# Patient Record
Sex: Female | Born: 1975 | Hispanic: No | Marital: Married | Smoking: Never smoker
Health system: Southern US, Community
[De-identification: ages and names within clinical notes are randomized; demographics above are authoritative.]

## PROBLEM LIST (undated history)

## (undated) ENCOUNTER — Inpatient Hospital Stay (HOSPITAL_COMMUNITY): Payer: Self-pay

## (undated) DIAGNOSIS — R51 Headache: Secondary | ICD-10-CM

## (undated) DIAGNOSIS — Z5189 Encounter for other specified aftercare: Secondary | ICD-10-CM

## (undated) DIAGNOSIS — IMO0002 Reserved for concepts with insufficient information to code with codable children: Secondary | ICD-10-CM

## (undated) DIAGNOSIS — B999 Unspecified infectious disease: Secondary | ICD-10-CM

## (undated) HISTORY — PX: HERNIA REPAIR: SHX51

---

## 2014-01-05 ENCOUNTER — Inpatient Hospital Stay (HOSPITAL_COMMUNITY): Payer: Self-pay

## 2014-01-05 ENCOUNTER — Encounter (HOSPITAL_COMMUNITY): Payer: Self-pay | Admitting: *Deleted

## 2014-01-05 ENCOUNTER — Inpatient Hospital Stay (HOSPITAL_COMMUNITY)
Admission: AD | Admit: 2014-01-05 | Discharge: 2014-01-05 | Disposition: A | Discharge status: Home / Self Care | Payer: Self-pay | Source: Ambulatory Visit | Attending: Obstetrics & Gynecology | Admitting: Obstetrics & Gynecology

## 2014-01-05 DIAGNOSIS — O4690 Antepartum hemorrhage, unspecified, unspecified trimester: Secondary | ICD-10-CM | POA: Insufficient documentation

## 2014-01-05 DIAGNOSIS — Z3201 Encounter for pregnancy test, result positive: Secondary | ICD-10-CM | POA: Insufficient documentation

## 2014-01-05 DIAGNOSIS — O209 Hemorrhage in early pregnancy, unspecified: Secondary | ICD-10-CM

## 2014-01-05 DIAGNOSIS — Z3490 Encounter for supervision of normal pregnancy, unspecified, unspecified trimester: Secondary | ICD-10-CM

## 2014-01-05 DIAGNOSIS — Z3480 Encounter for supervision of other normal pregnancy, unspecified trimester: Secondary | ICD-10-CM

## 2014-01-05 HISTORY — DX: Reserved for concepts with insufficient information to code with codable children: IMO0002

## 2014-01-05 HISTORY — DX: Encounter for other specified aftercare: Z51.89

## 2014-01-05 HISTORY — DX: Headache: R51

## 2014-01-05 HISTORY — DX: Unspecified infectious disease: B99.9

## 2014-01-05 LAB — CBC
HCT: 33.7 % — ABNORMAL LOW (ref 36.0–46.0)
Hemoglobin: 11.1 g/dL — ABNORMAL LOW (ref 12.0–15.0)
MCH: 27.4 pg (ref 26.0–34.0)
MCHC: 32.9 g/dL (ref 30.0–36.0)
MCV: 83.2 fL (ref 78.0–100.0)
PLATELETS: 164 10*3/uL (ref 150–400)
RBC: 4.05 MIL/uL (ref 3.87–5.11)
RDW: 14.3 % (ref 11.5–15.5)
WBC: 7.3 10*3/uL (ref 4.0–10.5)

## 2014-01-05 LAB — WET PREP, GENITAL
CLUE CELLS WET PREP: NONE SEEN
Trich, Wet Prep: NONE SEEN
Yeast Wet Prep HPF POC: NONE SEEN

## 2014-01-05 LAB — URINALYSIS, ROUTINE W REFLEX MICROSCOPIC
BILIRUBIN URINE: NEGATIVE
Glucose, UA: NEGATIVE mg/dL
Ketones, ur: NEGATIVE mg/dL
Leukocytes, UA: NEGATIVE
NITRITE: NEGATIVE
Protein, ur: NEGATIVE mg/dL
UROBILINOGEN UA: 0.2 mg/dL (ref 0.0–1.0)
pH: 6 (ref 5.0–8.0)

## 2014-01-05 LAB — URINE MICROSCOPIC-ADD ON

## 2014-01-05 LAB — POCT PREGNANCY, URINE: PREG TEST UR: POSITIVE — AB

## 2014-01-05 LAB — HCG, QUANTITATIVE, PREGNANCY: HCG, BETA CHAIN, QUANT, S: 138995 m[IU]/mL — AB (ref <5)

## 2014-01-05 LAB — ABO/RH: ABO/RH(D): O POS

## 2014-01-05 NOTE — MAU Provider Note (Signed)
History     CSN: [id number]  Arrival date and time: 01/05/14 [id number]   First Provider Initiated Contact with Patient 01/05/14 1057      Chief Complaint  Patient presents with  . Possible Pregnancy  . Vaginal Bleeding   HPI  Angela Strickland is a 38 y.o. female [id number] at 3957w2d who presents with brown vaginal discharge that started on Saturday. The patient is also here for a confirmation of pregnancy; this pregnancy was not planned. She denies pain at this time and feels the discharge has decreased in amount since Saturday.   OB History   Grav Para Term Preterm Abortions TAB SAB Ect Mult Living   5 3 2 1 1  0 1 0 0 3      Past Medical History  Diagnosis Date  . Blood transfusion without reported diagnosis     with c/s  . Headache(784.0)   . Infection     UTI  . Degenerative disk disease     Past Surgical History  Procedure Laterality Date  . Cesarean section    . Hernia repair      Family History  Problem Relation Age of Onset  . Diabetes Mother   . Cancer Father     liver  . Hearing loss Other     History  Substance Use Topics  . Smoking status: Never Smoker   . Smokeless tobacco: Never Used  . Alcohol Use: No    Allergies: No Known Allergies  Prescriptions prior to admission  Medication Sig Dispense Refill  . Budesonide (RHINOCORT AQUA NA) Place 1 spray into the nose daily as needed (congestion).       Results for orders placed during the hospital encounter of 01/05/14 (from the past 48 hour(s))  URINALYSIS, ROUTINE W REFLEX MICROSCOPIC     Status: Abnormal   Collection Time    01/05/14 10:20 AM      Result Value Ref Range   Color, Urine YELLOW  YELLOW   APPearance CLEAR  CLEAR   Specific Gravity, Urine >1.030 (*) 1.005 - 1.030   pH 6.0  5.0 - 8.0   Glucose, UA NEGATIVE  NEGATIVE mg/dL   Hgb urine dipstick MODERATE (*) NEGATIVE   Bilirubin Urine NEGATIVE  NEGATIVE   Ketones, ur NEGATIVE  NEGATIVE mg/dL   Protein, ur NEGATIVE  NEGATIVE mg/dL   Urobilinogen, UA 0.2  0.0 - 1.0 mg/dL   Nitrite NEGATIVE  NEGATIVE   Leukocytes, UA NEGATIVE  NEGATIVE  URINE MICROSCOPIC-ADD ON     Status: Abnormal   Collection Time    01/05/14 10:20 AM      Result Value Ref Range   Squamous Epithelial / LPF FEW (*) RARE   WBC, UA 0-2  <3 WBC/hpf   RBC / HPF 7-10  <3 RBC/hpf   Bacteria, UA MANY (*) RARE   Urine-Other MUCOUS PRESENT    POCT PREGNANCY, URINE     Status: Abnormal   Collection Time    01/05/14 10:33 AM      Result Value Ref Range   Preg Test, Ur POSITIVE (*) NEGATIVE   Comment:            THE SENSITIVITY OF THIS     METHODOLOGY IS >24 mIU/mL  ABO/RH     Status: None   Collection Time    01/05/14 10:57 AM      Result Value Ref Range   ABO/RH(D) O POS    HCG, QUANTITATIVE, PREGNANCY  Status: Abnormal   Collection Time    01/05/14 10:57 AM      Result Value Ref Range   hCG, Beta Francene FindersChain, Quant, S [id number] (*) <5 mIU/mL   Comment:              GEST. AGE      CONC.  (mIU/mL)       <=1 WEEK        5 - 50         2 WEEKS       50 - 500         3 WEEKS       100 - 10,000         4 WEEKS     1,000 - 30,000         5 WEEKS     3,500 - 115,000       6-8 WEEKS     12,000 - 270,000        12 WEEKS     15,000 - 220,000                FEMALE AND NON-PREGNANT FEMALE:         LESS THAN 5 mIU/mL  CBC     Status: Abnormal   Collection Time    01/05/14 10:58 AM      Result Value Ref Range   WBC 7.3  4.0 - 10.5 K/uL   RBC 4.05  3.87 - 5.11 MIL/uL   Hemoglobin 11.1 (*) 12.0 - 15.0 g/dL   HCT 04.533.7 (*) 40.936.0 - 81.146.0 %   MCV 83.2  78.0 - 100.0 fL   MCH 27.4  26.0 - 34.0 pg   MCHC 32.9  30.0 - 36.0 g/dL   RDW 91.414.3  78.211.5 - 95.615.5 %   Platelets 164  150 - 400 K/uL   [place] Ob Comp Less 14 Wks  01/05/2014   CLINICAL DATA:  Vaginal bleeding.  Positive pregnancy test.  EXAM: OBSTETRIC <14 WK US AND TRANSVAGINAL OB US  TECHNIQUE: Both transabdominal and transvaginal ultrasound examinations were performed for complete evaluation of the gestation as well  as the maternal uterus, adnexal regions, and pelvic cul-de-sac. Transvaginal technique was performed to assess early pregnancy.  COMPARISON:  None.  FINDINGS: Intrauterine gestational sac: Visualized/normal in shape.  Yolk sac:  Visualized  Embryo:  Visualized  Cardiac Activity: Visualized  Heart Rate:  160 bpm  CRL:   16  mm   8 w 0 d                  US EDC: 08/17/2014  Maternal uterus/adnexae: Both ovaries are normal in appearance. No mass or free fluid visualized.  IMPRESSION: Single living IUP measuring 8 weeks 0 days with US EDC of 08/17/2014.  No significant maternal uterine or adnexal abnormality identified.   Electronically Signed   By: Myles RosenthalJohn  Stahl M.D.   On: 01/05/2014 11:43   [place] Ob Transvaginal  01/05/2014   CLINICAL DATA:  Vaginal bleeding.  Positive pregnancy test.  EXAM: OBSTETRIC <14 WK US AND TRANSVAGINAL OB US  TECHNIQUE: Both transabdominal and transvaginal ultrasound examinations were performed for complete evaluation of the gestation as well as the maternal uterus, adnexal regions, and pelvic cul-de-sac. Transvaginal technique was performed to assess early pregnancy.  COMPARISON:  None.  FINDINGS: Intrauterine gestational sac: Visualized/normal in shape.  Yolk sac:  Visualized  Embryo:  Visualized  Cardiac Activity: Visualized  Heart Rate:  160 bpm  CRL:  16  mm   8 w 0 d                  [place] EDC: 08/17/2014  Maternal uterus/adnexae: Both ovaries are normal in appearance. No mass or free fluid visualized.  IMPRESSION: Single living IUP measuring 8 weeks 0 days with [place] EDC of 08/17/2014.  No significant maternal uterine or adnexal abnormality identified.   Electronically Signed   By: Myles Rosenthal M.D.   On: 01/05/2014 11:43     Review of Systems  Constitutional: Negative for fever and chills.  Gastrointestinal: Negative for nausea, vomiting, abdominal pain, diarrhea and constipation.  Genitourinary: Negative for dysuria, urgency, frequency and hematuria.       No vaginal discharge. +  vaginal bleeding; small amount- brown  No dysuria.    Physical Exam   Blood pressure 115/66, pulse 99, temperature 98.5 F (36.9 C), resp. rate 16, height 5\' 3"  (1.6 m), weight 61.689 kg (136 lb), last menstrual period 11/15/2013, SpO2 100.00%.  Physical Exam  Constitutional: She is oriented to person, place, and time. She appears well-developed and well-nourished. No distress.  HENT:  Head: Normocephalic.  Eyes: Pupils are equal, round, and reactive to light.  Neck: Neck supple.  Respiratory: Effort normal.  GI: Soft. She exhibits no distension. There is no tenderness. There is no rebound.  Genitourinary:  Speculum exam: Vagina - Small amount of creamy, brown discharge, no odor Cervix - No contact bleeding, small amount of brown, mucus like discharge at cervical os.  Bimanual exam: Cervix closed, no CMT  Uterus non tender, enlarged.  Adnexa non tender, no masses bilaterally GC/Chlam, wet prep done Chaperone present for exam.   Musculoskeletal: Normal range of motion.  Neurological: She is alert and oriented to person, place, and time.  Skin: Skin is warm. She is not diaphoretic.  Psychiatric: Her behavior is normal.    MAU Course  Procedures None  MDM ABO Beta hcg CBC [place] Wet prep GC O positive blood type   Assessment and Plan   Assessment:  Single IUP with cardiac activity  Vaginal bleeding in early pregnancy   Plan:  Discharge home in stable condition Bleeding precautions discussed Start prenatal care ASAP Contact the [facility] Department Return to MAU as needed, if symptoms worsen  O positive blood type.   Iona Hansen Rasch, NP  01/05/2014, 2:42 PM

## 2014-01-05 NOTE — MAU Provider Note (Signed)
Attestation of Attending Supervision of Advanced Practitioner (CNM/NP): Evaluation and management procedures were performed by the Advanced Practitioner under my supervision and collaboration.  I have reviewed the Advanced Practitioner's note and chart, and I agree with the management and plan.  Marquerite Forsman Harraway-Smith 3:15 PM     

## 2014-01-05 NOTE — MAU Note (Signed)
+  HPT about 2 wks ago.  Brownish spotting a couple days ago, no clots.  Some low back pain yesterday, none today.

## 2014-01-05 NOTE — MAU Note (Signed)
Patient states she has had a positive home pregnancy test about 2 weeks ago. States on 4-11 she had a lot of brown discharge. States she continues to have a little brown bleeding, no pain today but had some pain yesterday. Has some vomiting on and off with nausea.

## 2014-01-06 LAB — GC/CHLAMYDIA PROBE AMP
CT PROBE, AMP APTIMA: NEGATIVE
GC PROBE AMP APTIMA: NEGATIVE

## 2014-06-15 ENCOUNTER — Encounter: Payer: Self-pay | Admitting: Family Medicine

## 2014-06-15 ENCOUNTER — Ambulatory Visit (INDEPENDENT_AMBULATORY_CARE_PROVIDER_SITE_OTHER): Payer: Self-pay | Admitting: Family Medicine

## 2014-06-15 VITALS — BP 106/64 | HR 100 | Temp 98.1°F | Wt 151.9 lb

## 2014-06-15 DIAGNOSIS — O0990 Supervision of high risk pregnancy, unspecified, unspecified trimester: Secondary | ICD-10-CM

## 2014-06-15 DIAGNOSIS — O0933 Supervision of pregnancy with insufficient antenatal care, third trimester: Secondary | ICD-10-CM

## 2014-06-15 DIAGNOSIS — O09529 Supervision of elderly multigravida, unspecified trimester: Secondary | ICD-10-CM

## 2014-06-15 DIAGNOSIS — O09523 Supervision of elderly multigravida, third trimester: Secondary | ICD-10-CM

## 2014-06-15 DIAGNOSIS — O0993 Supervision of high risk pregnancy, unspecified, third trimester: Secondary | ICD-10-CM

## 2014-06-15 DIAGNOSIS — O0930 Supervision of pregnancy with insufficient antenatal care, unspecified trimester: Secondary | ICD-10-CM

## 2014-06-15 LAB — POCT URINALYSIS DIP (DEVICE)
BILIRUBIN URINE: NEGATIVE
GLUCOSE, UA: NEGATIVE mg/dL
Hgb urine dipstick: NEGATIVE
Ketones, ur: NEGATIVE mg/dL
LEUKOCYTES UA: NEGATIVE
NITRITE: NEGATIVE
Protein, ur: NEGATIVE mg/dL
Specific Gravity, Urine: 1.02 (ref 1.005–1.030)
UROBILINOGEN UA: 0.2 mg/dL (ref 0.0–1.0)
pH: 6.5 (ref 5.0–8.0)

## 2014-06-15 NOTE — Progress Notes (Signed)
U/S 06/24/14 @ 930a.

## 2014-06-15 NOTE — Progress Notes (Signed)
Nutrition note: 1st visit consult Pt has gained 21.9# @ [redacted]w[redacted]d, which is wnl. Pt reports eating 3 meals & 1-2 snacks/d. Pt was taking a PNV but is only taking folic acid & vitamin B6 currently. Pt reports no N/V but has some heartburn. Pt received verbal & written education on general nutrition during pregnancy. Discussed tips to decrease heartburn. Encouraged PNV. Discussed wt gain goals of 25-35# or 1#/wk. Pt agrees to restart taking a PNV. Pt does not have WIC but plans to apply. Pt plans to BF. F/u as needed Blondell Reveal, MS, RD, LDN, [facility]

## 2014-06-15 NOTE — Progress Notes (Signed)
   Subjective:    Angela Strickland is a [id number] [redacted]w[redacted]d being seen today for her first obstetrical visit.  Her obstetrical history is significant for advanced maternal age. Patient does intend to breast feed. Pregnancy history fully reviewed.  Patient reports no complaints.  Filed Vitals:   06/15/14 0910  BP: 106/64  Pulse: 100  Temp: 98.1 F (36.7 C)  Weight: 151 lb 14.4 oz (68.9 kg)    HISTORY: OB History  Gravida Para Term Preterm AB SAB TAB Ectopic Multiple Living  0 0 0 3    # Outcome Date GA Lbr Len/2nd Weight Sex Delivery Anes PTL Lv  5 CUR           4 SAB           3 PRE              Comments: previa, emergency c/s  2 TRM      SVD   Y  1 TRM      SVD   Y     Emergent C/s 2/2 placenta previa with vaginal bleeding at 29-30 weeks, was told she could have a vaginal delivery after the cesarean.  Past Medical History  Diagnosis Date  . Blood transfusion without reported diagnosis     with c/s  . Headache(784.0)   . Infection     UTI  . Degenerative disk disease    Past Surgical History  Procedure Laterality Date  . Cesarean section    . Hernia repair     Family History  Problem Relation Age of Onset  . Diabetes Mother   . Cancer Father     liver  . Hearing loss Other      Exam    Uterus:     Pelvic Exam:    Perineum: No Hemorrhoids   Vulva: normal   Vagina:  normal mucosa, normal discharge   pH: Not performed   Cervix: multiparous appearance, no cervical motion tenderness and no lesions   Adnexa: normal adnexa   Bony Pelvis: average  System:     Skin: normal coloration and turgor, no rashes    Neurologic: oriented, normal, normal mood   Extremities: normal strength, tone, and muscle mass, no deformities, no erythema, induration, or nodules   HEENT PERRLA and extra ocular movement intact   Mouth/Teeth mucous membranes moist, pharynx normal without lesions   Neck supple and no masses   Cardiovascular: Regular rate   Respiratory:   appears well, vitals normal, no respiratory distress, acyanotic, normal RR, ear and throat exam is normal, neck free of mass or lymphadenopathy, chest clear, no wheezing, crepitations, rhonchi, normal symmetric air entry   Abdomen: soft, non-tender; bowel sounds normal; no masses,  no organomegaly   Urinary: urethral meatus normal      Assessment:    Pregnancy: [id number] There are no active problems to display for this patient.       Plan:     Initial labs drawn. Prenatal vitamins. Problem list reviewed and updated. Genetic Screening discussed - too late  Ultrasound discussed; fetal survey: ordered.  Follow up in 2 weeks. 50% of 25 min visit spent on counseling and coordination of care.    Angela Strickland 06/15/2014

## 2014-06-15 NOTE — Addendum Note (Signed)
Addended by: Kathee Delton on: 06/15/2014 04:47 PM   Modules accepted: Orders

## 2014-06-16 LAB — GC/CHLAMYDIA PROBE AMP
CT Probe RNA: NEGATIVE
GC PROBE AMP APTIMA: NEGATIVE

## 2014-06-16 LAB — HIV ANTIBODY (ROUTINE TESTING W REFLEX): HIV 1&2 Ab, 4th Generation: NONREACTIVE

## 2014-06-16 LAB — GLUCOSE TOLERANCE, 1 HOUR (50G) W/O FASTING: Glucose, 1 Hour GTT: 156 mg/dL — ABNORMAL HIGH (ref 70–140)

## 2014-06-17 LAB — PRESCRIPTION MONITORING PROFILE (19 PANEL)
Amphetamine/Meth: NEGATIVE ng/mL
Barbiturate Screen, Urine: NEGATIVE ng/mL
Benzodiazepine Screen, Urine: NEGATIVE ng/mL
Buprenorphine, Urine: NEGATIVE ng/mL
Cannabinoid Scrn, Ur: NEGATIVE ng/mL
Carisoprodol, Urine: NEGATIVE ng/mL
Cocaine Metabolites: NEGATIVE ng/mL
Creatinine, Urine: 113.88 mg/dL (ref >20.0)
ECSTASY: NEGATIVE ng/mL
Fentanyl, Ur: NEGATIVE ng/mL
MEPERIDINE UR: NEGATIVE ng/mL
Methadone Screen, Urine: NEGATIVE ng/mL
Methaqualone: NEGATIVE ng/mL
Nitrites, Initial: NEGATIVE ug/mL
Opiate Screen, Urine: NEGATIVE ng/mL
Oxycodone Screen, Ur: NEGATIVE ng/mL
PH URINE, INITIAL: 6.4 pH (ref 4.5–8.9)
Phencyclidine, Ur: NEGATIVE ng/mL
Propoxyphene: NEGATIVE ng/mL
Tapentadol, urine: NEGATIVE ng/mL
Tramadol Scrn, Ur: NEGATIVE ng/mL
Zolpidem, Urine: NEGATIVE ng/mL

## 2014-06-17 LAB — OBSTETRIC PANEL
Antibody Screen: NEGATIVE
Basophils Absolute: 0 10*3/uL (ref 0.0–0.1)
Basophils Relative: 0 % (ref 0–1)
EOS PCT: 3 % (ref 0–5)
Eosinophils Absolute: 0.2 10*3/uL (ref 0.0–0.7)
HCT: 29 % — ABNORMAL LOW (ref 36.0–46.0)
HEP B S AG: NEGATIVE
Hemoglobin: 9.7 g/dL — ABNORMAL LOW (ref 12.0–15.0)
LYMPHS PCT: 25 % (ref 12–46)
Lymphs Abs: 1.5 10*3/uL (ref 0.7–4.0)
MCH: 26.2 pg (ref 26.0–34.0)
MCHC: 33.4 g/dL (ref 30.0–36.0)
MCV: 78.4 fL (ref 78.0–100.0)
Monocytes Absolute: 0.4 10*3/uL (ref 0.1–1.0)
Monocytes Relative: 6 % (ref 3–12)
NEUTROS PCT: 66 % (ref 43–77)
Neutro Abs: 3.9 10*3/uL (ref 1.7–7.7)
PLATELETS: 127 10*3/uL — AB (ref 150–400)
RBC: 3.7 MIL/uL — ABNORMAL LOW (ref 3.87–5.11)
RDW: 14.4 % (ref 11.5–15.5)
RUBELLA: 2.1 {index} — AB (ref <0.90)
Rh Type: POSITIVE
WBC: 5.9 10*3/uL (ref 4.0–10.5)

## 2014-06-17 LAB — CYTOLOGY - PAP

## 2014-06-17 LAB — CULTURE, OB URINE

## 2014-06-18 LAB — HEMOGLOBINOPATHY EVALUATION
HGB F QUANT: 0 % (ref 0.0–2.0)
HGB S QUANTITAION: 0 %
Hemoglobin Other: 0 %
Hgb A2 Quant: 2.7 % (ref 2.2–3.2)
Hgb A: 97.3 % (ref 96.8–97.8)

## 2014-06-22 ENCOUNTER — Telehealth: Payer: Self-pay

## 2014-06-22 NOTE — Telephone Encounter (Signed)
Patient 1hr gtt elevated-- needs 3hr gtt. Called patient with [facility] interpreter [phone]. Informed patient of results and the need for 3hr gtt. Patient states she has U/S appointment this Wednesday and would like to come the same day. Informed patient she could. Patient cannot come until 0845 as she needs to bring her daughter to school at 30. Informed patient we would put her on the schedule for this time. Explained that she must be fasting and cannot have anything to eat or drink after midnight except for water. Patient verbalized understanding and gratitude. No questions or concerns.

## 2014-06-22 NOTE — Telephone Encounter (Signed)
Message copied by Louanna Raw on Mon Jun 22, 2014 11:48 AM ------      Message from: Fredirick Lathe      Created: Sat Jun 20, 2014  9:41 PM       Please schedule for 2h gtt per protocol 2/2 elevated 1h gtt            ----- Message -----         From: [facility]         Sent: 06/16/2014   2:16 AM           To: Perry Mount, MD                   ------

## 2014-06-24 ENCOUNTER — Other Ambulatory Visit: Payer: Self-pay

## 2014-06-24 ENCOUNTER — Ambulatory Visit (HOSPITAL_COMMUNITY)
Admission: RE | Admit: 2014-06-24 | Discharge: 2014-06-24 | Disposition: A | Discharge status: Home / Self Care | Payer: Self-pay | Source: Ambulatory Visit | Attending: Family Medicine | Admitting: Family Medicine

## 2014-06-24 DIAGNOSIS — O0930 Supervision of pregnancy with insufficient antenatal care, unspecified trimester: Secondary | ICD-10-CM | POA: Insufficient documentation

## 2014-06-24 DIAGNOSIS — O09899 Supervision of other high risk pregnancies, unspecified trimester: Secondary | ICD-10-CM | POA: Insufficient documentation

## 2014-06-24 DIAGNOSIS — O0993 Supervision of high risk pregnancy, unspecified, third trimester: Secondary | ICD-10-CM

## 2014-06-24 DIAGNOSIS — O0933 Supervision of pregnancy with insufficient antenatal care, third trimester: Secondary | ICD-10-CM

## 2014-06-24 DIAGNOSIS — O09523 Supervision of elderly multigravida, third trimester: Secondary | ICD-10-CM

## 2014-06-24 DIAGNOSIS — O09529 Supervision of elderly multigravida, unspecified trimester: Secondary | ICD-10-CM | POA: Insufficient documentation

## 2014-06-25 ENCOUNTER — Encounter: Payer: Self-pay | Admitting: Family Medicine

## 2014-06-25 DIAGNOSIS — O24419 Gestational diabetes mellitus in pregnancy, unspecified control: Secondary | ICD-10-CM | POA: Insufficient documentation

## 2014-06-25 LAB — GLUCOSE TOLERANCE, 3 HOURS
GLUCOSE, 1 HOUR-GESTATIONAL: 160 mg/dL (ref 70–189)
Glucose Tolerance, 2 hour: 181 mg/dL — ABNORMAL HIGH (ref 70–164)
Glucose Tolerance, Fasting: 81 mg/dL (ref 70–104)
Glucose, GTT - 3 Hour: 150 mg/dL — ABNORMAL HIGH (ref 70–144)

## 2014-06-29 ENCOUNTER — Encounter: Payer: Self-pay | Admitting: Family Medicine

## 2014-06-29 ENCOUNTER — Encounter: Payer: Self-pay | Attending: Family Medicine | Admitting: *Deleted

## 2014-06-29 ENCOUNTER — Ambulatory Visit (INDEPENDENT_AMBULATORY_CARE_PROVIDER_SITE_OTHER): Payer: Self-pay | Admitting: Family Medicine

## 2014-06-29 VITALS — BP 106/68 | HR 110 | Temp 98.0°F | Wt 152.9 lb

## 2014-06-29 DIAGNOSIS — O3421 Maternal care for scar from previous cesarean delivery: Secondary | ICD-10-CM

## 2014-06-29 DIAGNOSIS — O09513 Supervision of elderly primigravida, third trimester: Secondary | ICD-10-CM | POA: Insufficient documentation

## 2014-06-29 DIAGNOSIS — O34219 Maternal care for unspecified type scar from previous cesarean delivery: Secondary | ICD-10-CM | POA: Insufficient documentation

## 2014-06-29 DIAGNOSIS — Z713 Dietary counseling and surveillance: Secondary | ICD-10-CM | POA: Insufficient documentation

## 2014-06-29 DIAGNOSIS — O0993 Supervision of high risk pregnancy, unspecified, third trimester: Secondary | ICD-10-CM

## 2014-06-29 DIAGNOSIS — O24419 Gestational diabetes mellitus in pregnancy, unspecified control: Secondary | ICD-10-CM | POA: Insufficient documentation

## 2014-06-29 LAB — POCT URINALYSIS DIP (DEVICE)
Bilirubin Urine: NEGATIVE
Glucose, UA: NEGATIVE mg/dL
Ketones, ur: NEGATIVE mg/dL
Leukocytes, UA: NEGATIVE
NITRITE: NEGATIVE
Protein, ur: NEGATIVE mg/dL
Specific Gravity, Urine: 1.015 (ref 1.005–1.030)
Urobilinogen, UA: 0.2 mg/dL (ref 0.0–1.0)
pH: 6 (ref 5.0–8.0)

## 2014-06-29 NOTE — Progress Notes (Signed)
Round ligament pain

## 2014-06-29 NOTE — Patient Instructions (Addendum)
Having a circumcision done in the hospital costs approximately $480.  This will have to be paid in full prior to circumcision being performed.  There are places to have circumcision done as an outpatient which are cheaper.    Circumcisions      Provider   Phone    Price     ------------------------------------------------------------------------------   [facility]  [phone]  $480 by 4 wks     [facility]   [phone]  $[phone] by 4 wks     [facility]   [phone]  $175 by 2 wks    [facility]   [phone]  $250 by 7 days [facility]   [phone]  $150 by 4 wks Vaginal Birth After Cesarean Delivery Vaginal birth after cesarean delivery (VBAC) is giving birth vaginally after previously delivering a baby by a cesarean. In the past, if a woman had a cesarean delivery, all births afterward would be done by cesarean delivery. This is no longer true. It can be safe for the mother to try a vaginal delivery after having a cesarean delivery.  It is important to discuss VBAC with your health care provider early in the pregnancy so you can understand the risks, benefits, and options. It will give you time to decide what is best in your particular case. The final decision about whether to have a VBAC or repeat cesarean delivery should be between you and your health care provider. Any changes in your health or your baby's health during your pregnancy may make it necessary to change your initial decision about VBAC.  WOMEN WHO PLAN TO HAVE A VBAC SHOULD CHECK WITH THEIR HEALTH CARE PROVIDER TO BE SURE THAT:  The previous cesarean delivery was done with a low transverse uterine cut (incision) (not a vertical classical incision).   The birth canal is big enough for the baby.   There were no other operations on the uterus.   An electronic fetal monitor (EFM) will be on at all times during labor.   An operating room will be available and ready in case an emergency cesarean delivery is needed.   A health care provider and  surgical nursing staff will be available at all times during labor to be ready to do an emergency delivery cesarean if necessary.   An anesthesiologist will be present in case an emergency cesarean delivery is needed.   The nursery is prepared and has adequate personnel and necessary equipment available to care for the baby in case of an emergency cesarean delivery. BENEFITS OF VBAC  Shorter stay in the hospital.   Avoidance of risks associated with cesarean delivery, such as:  Surgical complications, such as opening of the incision or hernia in the incision.  Injury to other organs.  Fever. This can occur if an infection develops after surgery. It can also occur as a reaction to the medicine given to make you numb during the surgery.  Less blood loss and need for blood transfusions.  Lower risk of blood clots and infection.  Shorter recovery.   Decreased risk for having to remove the uterus (hysterectomy).   Decreased risk for the placenta to completely or partially cover the opening of the uterus (placenta previa) with a future pregnancy.   Decrease risk in future labor and delivery. RISKS OF A VBAC  Tearing (rupture) of the uterus. This is occurs in less than 1% of VBACs. The risk of this happening is higher if:  Steps are taken to begin the labor process (induce labor) or  stimulate or strengthen contractions (augment labor).   Medicine is used to soften (ripen) the cervix.  Having to remove the uterus (hysterectomy) if it ruptures. VBAC SHOULD NOT BE DONE IF:  The previous cesarean delivery was done with a vertical (classical) or T-shaped incision or you do not know what kind of incision was made.   You had a ruptured uterus.   You have had certain types of surgery on your uterus, such as removal of uterine fibroids. Ask your health care provider about other types of surgeries that prevent you from having a VBAC.  You have certain medical or childbirth  (obstetrical) problems.   There are problems with the baby.   You have had two previous cesarean deliveries and no vaginal deliveries. OTHER FACTS TO KNOW ABOUT VBAC:  It is safe to have an epidural anesthetic with VBAC.   It is safe to turn the baby from a breech position (attempt an external cephalic version).   It is safe to try a VBAC with twins.   VBAC may not be successful if your baby weights 8.8 lb (4 kg) or more. However, weight predictions are not always accurate and should not be used alone to decide if VBAC is right for you.  There is an increased failure rate if the time between the cesarean delivery and VBAC is less than 19 months.   Your health care provider may advise against a VBAC if you have preeclampsia (high blood pressure, protein in the urine, and swelling of face and extremities).   VBAC is often successful if you previously gave birth vaginally.   VBAC is often successful when the labor starts spontaneously before the due date.   Delivering a baby through a VBAC is similar to having a normal spontaneous vaginal delivery. Document Released: 03/04/2007 Document Revised: 01/26/2014 Document Reviewed: 04/10/2013 [facility] Patient Information 2015 [place], [place]. This information is not intended to replace advice given to you by your health care provider. Make sure you discuss any questions you have with your health care provider.  Third Trimester of Pregnancy The third trimester is from week 29 through week 42, months 7 through 9. The third trimester is a time when the fetus is growing rapidly. At the end of the ninth month, the fetus is about 20 inches in length and weighs 6-10 pounds.  BODY CHANGES Your body goes through many changes during pregnancy. The changes vary from woman to woman.   Your weight will continue to increase. You can expect to gain 25-35 pounds (11-16 kg) by the end of the pregnancy.  You may begin to get stretch marks on your  hips, abdomen, and breasts.  You may urinate more often because the fetus is moving lower into your pelvis and pressing on your bladder.  You may develop or continue to have heartburn as a result of your pregnancy.  You may develop constipation because certain hormones are causing the muscles that push waste through your intestines to slow down.  You may develop hemorrhoids or swollen, bulging veins (varicose veins).  You may have pelvic pain because of the weight gain and pregnancy hormones relaxing your joints between the bones in your pelvis. Backaches may result from overexertion of the muscles supporting your posture.  You may have changes in your hair. These can include thickening of your hair, rapid growth, and changes in texture. Some women also have hair loss during or after pregnancy, or hair that feels dry or thin. Your hair will most  likely return to normal after your baby is born.  Your breasts will continue to grow and be tender. A yellow discharge may leak from your breasts called colostrum.  Your belly button may stick out.  You may feel short of breath because of your expanding uterus.  You may notice the fetus "dropping," or moving lower in your abdomen.  You may have a bloody mucus discharge. This usually occurs a few days to a week before labor begins.  Your cervix becomes thin and soft (effaced) near your due date. WHAT TO EXPECT AT YOUR PRENATAL EXAMS  You will have prenatal exams every 2 weeks until week 36. Then, you will have weekly prenatal exams. During a routine prenatal visit:  You will be weighed to make sure you and the fetus are growing normally.  Your blood pressure is taken.  Your abdomen will be measured to track your baby's growth.  The fetal heartbeat will be listened to.  Any test results from the previous visit will be discussed.  You may have a cervical check near your due date to see if you have effaced. At around 36 weeks, your  caregiver will check your cervix. At the same time, your caregiver will also perform a test on the secretions of the vaginal tissue. This test is to determine if a type of bacteria, Group B streptococcus, is present. Your caregiver will explain this further. Your caregiver may ask you:  What your birth plan is.  How you are feeling.  If you are feeling the baby move.  If you have had any abnormal symptoms, such as leaking fluid, bleeding, severe headaches, or abdominal cramping.  If you have any questions. Other tests or screenings that may be performed during your third trimester include:  Blood tests that check for low iron levels (anemia).  Fetal testing to check the health, activity level, and growth of the fetus. Testing is done if you have certain medical conditions or if there are problems during the pregnancy. FALSE LABOR You may feel small, irregular contractions that eventually go away. These are called Braxton Hicks contractions, or false labor. Contractions may last for hours, days, or even weeks before true labor sets in. If contractions come at regular intervals, intensify, or become painful, it is best to be seen by your caregiver.  SIGNS OF LABOR   Menstrual-like cramps.  Contractions that are 5 minutes apart or less.  Contractions that start on the top of the uterus and spread down to the lower abdomen and back.  A sense of increased pelvic pressure or back pain.  A watery or bloody mucus discharge that comes from the vagina. If you have any of these signs before the 37th week of pregnancy, call your caregiver right away. You need to go to the hospital to get checked immediately. HOME CARE INSTRUCTIONS   Avoid all smoking, herbs, alcohol, and unprescribed drugs. These chemicals affect the formation and growth of the baby.  Follow your caregiver's instructions regarding medicine use. There are medicines that are either safe or unsafe to take during  pregnancy.  Exercise only as directed by your caregiver. Experiencing uterine cramps is a good sign to stop exercising.  Continue to eat regular, healthy meals.  Wear a good support bra for breast tenderness.  Do not use hot tubs, steam rooms, or saunas.  Wear your seat belt at all times when driving.  Avoid raw meat, uncooked cheese, cat litter boxes, and soil used by cats. These carry  germs that can cause birth defects in the baby.  Take your prenatal vitamins.  Try taking a stool softener (if your caregiver approves) if you develop constipation. Eat more high-fiber foods, such as fresh vegetables or fruit and whole grains. Drink plenty of fluids to keep your urine clear or pale yellow.  Take warm sitz baths to soothe any pain or discomfort caused by hemorrhoids. Use hemorrhoid cream if your caregiver approves.  If you develop varicose veins, wear support hose. Elevate your feet for 15 minutes, 3-4 times a day. Limit salt in your diet.  Avoid heavy lifting, wear low heal shoes, and practice good posture.  Rest a lot with your legs elevated if you have leg cramps or low back pain.  Visit your dentist if you have not gone during your pregnancy. Use a soft toothbrush to brush your teeth and be gentle when you floss.  A sexual relationship may be continued unless your caregiver directs you otherwise.  Do not travel far distances unless it is absolutely necessary and only with the approval of your caregiver.  Take prenatal classes to understand, practice, and ask questions about the labor and delivery.  Make a trial run to the hospital.  Pack your hospital bag.  Prepare the baby's nursery.  Continue to go to all your prenatal visits as directed by your caregiver. SEEK MEDICAL CARE IF:  You are unsure if you are in labor or if your water has broken.  You have dizziness.  You have mild pelvic cramps, pelvic pressure, or nagging pain in your abdominal area.  You have  persistent nausea, vomiting, or diarrhea.  You have a bad smelling vaginal discharge.  You have pain with urination. SEEK IMMEDIATE MEDICAL CARE IF:   You have a fever.  You are leaking fluid from your vagina.  You have spotting or bleeding from your vagina.  You have severe abdominal cramping or pain.  You have rapid weight loss or gain.  You have shortness of breath with chest pain.  You notice sudden or extreme swelling of your face, hands, ankles, feet, or legs.  You have not felt your baby move in over an hour.  You have severe headaches that do not go away with medicine.  You have vision changes. Document Released: 09/05/2001 Document Revised: 09/16/2013 Document Reviewed: 11/12/2012 [facility] Patient Information 2015 [place], [place]. This information is not intended to replace advice given to you by your health care provider. Make sure you discuss any questions you have with your health care provider.  Breastfeeding Deciding to breastfeed is one of the best choices you can make for you and your baby. A change in hormones during pregnancy causes your breast tissue to grow and increases the number and size of your milk ducts. These hormones also allow proteins, sugars, and fats from your blood supply to make breast milk in your milk-producing glands. Hormones prevent breast milk from being released before your baby is born as well as prompt milk flow after birth. Once breastfeeding has begun, thoughts of your baby, as well as his or her sucking or crying, can stimulate the release of milk from your milk-producing glands.  BENEFITS OF BREASTFEEDING For Your Baby  Your first milk (colostrum) helps your baby's digestive system function better.   There are antibodies in your milk that help your baby fight off infections.   Your baby has a lower incidence of asthma, allergies, and sudden infant death syndrome.   The nutrients in breast milk are  better for your baby than  infant formulas and are designed uniquely for your baby's needs.   Breast milk improves your baby's brain development.   Your baby is less likely to develop other conditions, such as childhood obesity, asthma, or type 2 diabetes mellitus.  For You   Breastfeeding helps to create a very special bond between you and your baby.   Breastfeeding is convenient. Breast milk is always available at the correct temperature and costs nothing.   Breastfeeding helps to burn calories and helps you lose the weight gained during pregnancy.   Breastfeeding makes your uterus contract to its prepregnancy size faster and slows bleeding (lochia) after you give birth.   Breastfeeding helps to lower your risk of developing type 2 diabetes mellitus, osteoporosis, and breast or ovarian cancer later in life. SIGNS THAT YOUR BABY IS HUNGRY Early Signs of Hunger  Increased alertness or activity.  Stretching.  Movement of the head from side to side.  Movement of the head and opening of the mouth when the corner of the mouth or cheek is stroked (rooting).  Increased sucking sounds, smacking lips, cooing, sighing, or squeaking.  Hand-to-mouth movements.  Increased sucking of fingers or hands. Late Signs of Hunger  Fussing.  Intermittent crying. Extreme Signs of Hunger Signs of extreme hunger will require calming and consoling before your baby will be able to breastfeed successfully. Do not wait for the following signs of extreme hunger to occur before you initiate breastfeeding:   Restlessness.  A loud, strong cry.   Screaming. BREASTFEEDING BASICS Breastfeeding Initiation  Find a comfortable place to sit or lie down, with your neck and back well supported.  Place a pillow or rolled up blanket under your baby to bring him or her to the level of your breast (if you are seated). Nursing pillows are specially designed to help support your arms and your baby while you breastfeed.  Make sure  that your baby's abdomen is facing your abdomen.   Gently massage your breast. With your fingertips, massage from your chest wall toward your nipple in a circular motion. This encourages milk flow. You may need to continue this action during the feeding if your milk flows slowly.  Support your breast with 4 fingers underneath and your thumb above your nipple. Make sure your fingers are well away from your nipple and your baby's mouth.   Stroke your baby's lips gently with your finger or nipple.   When your baby's mouth is open wide enough, quickly bring your baby to your breast, placing your entire nipple and as much of the colored area around your nipple (areola) as possible into your baby's mouth.   More areola should be visible above your baby's upper lip than below the lower lip.   Your baby's tongue should be between his or her lower gum and your breast.   Ensure that your baby's mouth is correctly positioned around your nipple (latched). Your baby's lips should create a seal on your breast and be turned out (everted).  It is common for your baby to suck about 2-3 minutes in order to start the flow of breast milk. Latching Teaching your baby how to latch on to your breast properly is very important. An improper latch can cause nipple pain and decreased milk supply for you and poor weight gain in your baby. Also, if your baby is not latched onto your nipple properly, he or she may swallow some air during feeding. This can make your  baby fussy. Burping your baby when you switch breasts during the feeding can help to get rid of the air. However, teaching your baby to latch on properly is still the best way to prevent fussiness from swallowing air while breastfeeding. Signs that your baby has successfully latched on to your nipple:    Silent tugging or silent sucking, without causing you pain.   Swallowing heard between every 3-4 sucks.    Muscle movement above and in front of his  or her ears while sucking.  Signs that your baby has not successfully latched on to nipple:   Sucking sounds or smacking sounds from your baby while breastfeeding.  Nipple pain. If you think your baby has not latched on correctly, slip your finger into the corner of your baby's mouth to break the suction and place it between your baby's gums. Attempt breastfeeding initiation again. Signs of Successful Breastfeeding Signs from your baby:   A gradual decrease in the number of sucks or complete cessation of sucking.   Falling asleep.   Relaxation of his or her body.   Retention of a small amount of milk in his or her mouth.   Letting go of your breast by himself or herself. Signs from you:  Breasts that have increased in firmness, weight, and size 1-3 hours after feeding.   Breasts that are softer immediately after breastfeeding.  Increased milk volume, as well as a change in milk consistency and color by the fifth day of breastfeeding.   Nipples that are not sore, cracked, or bleeding. Signs That Your Pecola Leisure is Getting Enough Milk  Wetting at least 3 diapers in a 24-hour period. The urine should be clear and pale yellow by age 80 days.  At least 3 stools in a 24-hour period by age 80 days. The stool should be soft and yellow.  At least 3 stools in a 24-hour period by age 62 days. The stool should be seedy and yellow.  No loss of weight greater than 10% of birth weight during the first 8 days of age.  Average weight gain of 4-7 ounces (113-198 g) per week after age 188 days.  Consistent daily weight gain by age 80 days, without weight loss after the age of 2 weeks. After a feeding, your baby may spit up a small amount. This is common. BREASTFEEDING FREQUENCY AND DURATION Frequent feeding will help you make more milk and can prevent sore nipples and breast engorgement. Breastfeed when you feel the need to reduce the fullness of your breasts or when your baby shows signs of  hunger. This is called "breastfeeding on demand." Avoid introducing a pacifier to your baby while you are working to establish breastfeeding (the first 4-6 weeks after your baby is born). After this time you may choose to use a pacifier. Research has shown that pacifier use during the first year of a baby's life decreases the risk of sudden infant death syndrome (SIDS). Allow your baby to feed on each breast as long as he or she wants. Breastfeed until your baby is finished feeding. When your baby unlatches or falls asleep while feeding from the first breast, offer the second breast. Because newborns are often sleepy in the first few weeks of life, you may need to awaken your baby to get him or her to feed. Breastfeeding times will vary from baby to baby. However, the following rules can serve as a guide to help you ensure that your baby is properly fed:  Newborns (  babies 53 weeks of age or younger) may breastfeed every 1-3 hours.  Newborns should not go longer than 3 hours during the day or 5 hours during the night without breastfeeding.  You should breastfeed your baby a minimum of 8 times in a 24-hour period until you begin to introduce solid foods to your baby at around 26 months of age. BREAST MILK PUMPING Pumping and storing breast milk allows you to ensure that your baby is exclusively fed your breast milk, even at times when you are unable to breastfeed. This is especially important if you are going back to work while you are still breastfeeding or when you are not able to be present during feedings. Your lactation consultant can give you guidelines on how long it is safe to store breast milk.  A breast pump is a machine that allows you to pump milk from your breast into a sterile bottle. The pumped breast milk can then be stored in a refrigerator or freezer. Some breast pumps are operated by hand, while others use electricity. Ask your lactation consultant which type will work best for you. Breast  pumps can be purchased, but some hospitals and breastfeeding support groups lease breast pumps on a monthly basis. A lactation consultant can teach you how to hand express breast milk, if you prefer not to use a pump.  CARING FOR YOUR BREASTS WHILE YOU BREASTFEED Nipples can become dry, cracked, and sore while breastfeeding. The following recommendations can help keep your breasts moisturized and healthy:  Avoid using soap on your nipples.   Wear a supportive bra. Although not required, special nursing bras and tank tops are designed to allow access to your breasts for breastfeeding without taking off your entire bra or top. Avoid wearing underwire-style bras or extremely tight bras.  Air dry your nipples for 3-52minutes after each feeding.   Use only cotton bra pads to absorb leaked breast milk. Leaking of breast milk between feedings is normal.   Use lanolin on your nipples after breastfeeding. Lanolin helps to maintain your skin's normal moisture barrier. If you use pure lanolin, you do not need to wash it off before feeding your baby again. Pure lanolin is not toxic to your baby. You may also hand express a few drops of breast milk and gently massage that milk into your nipples and allow the milk to air dry. In the first few weeks after giving birth, some women experience extremely full breasts (engorgement). Engorgement can make your breasts feel heavy, warm, and tender to the touch. Engorgement peaks within 3-5 days after you give birth. The following recommendations can help ease engorgement:  Completely empty your breasts while breastfeeding or pumping. You may want to start by applying warm, moist heat (in the shower or with warm water-soaked hand towels) just before feeding or pumping. This increases circulation and helps the milk flow. If your baby does not completely empty your breasts while breastfeeding, pump any extra milk after he or she is finished.  Wear a snug bra (nursing or  regular) or tank top for 1-2 days to signal your body to slightly decrease milk production.  Apply ice packs to your breasts, unless this is too uncomfortable for you.  Make sure that your baby is latched on and positioned properly while breastfeeding. If engorgement persists after 48 hours of following these recommendations, contact your health care provider or a Advertising copywriter. OVERALL HEALTH CARE RECOMMENDATIONS WHILE BREASTFEEDING  Eat healthy foods. Alternate between meals and snacks,  eating 3 of each per day. Because what you eat affects your breast milk, some of the foods may make your baby more irritable than usual. Avoid eating these foods if you are sure that they are negatively affecting your baby.  Drink milk, fruit juice, and water to satisfy your thirst (about 10 glasses a day).   Rest often, relax, and continue to take your prenatal vitamins to prevent fatigue, stress, and anemia.  Continue breast self-awareness checks.  Avoid chewing and smoking tobacco.  Avoid alcohol and drug use. Some medicines that may be harmful to your baby can pass through breast milk. It is important to ask your health care provider before taking any medicine, including all over-the-counter and prescription medicine as well as vitamin and herbal supplements. It is possible to become pregnant while breastfeeding. If birth control is desired, ask your health care provider about options that will be safe for your baby. SEEK MEDICAL CARE IF:   You feel like you want to stop breastfeeding or have become frustrated with breastfeeding.  You have painful breasts or nipples.  Your nipples are cracked or bleeding.  Your breasts are red, tender, or warm.  You have a swollen area on either breast.  You have a fever or chills.  You have nausea or vomiting.  You have drainage other than breast milk from your nipples.  Your breasts do not become full before feedings by the fifth day after you  give birth.  You feel sad and depressed.  Your baby is too sleepy to eat well.  Your baby is having trouble sleeping.   Your baby is wetting less than 3 diapers in a 24-hour period.  Your baby has less than 3 stools in a 24-hour period.  Your baby's skin or the white part of his or her eyes becomes yellow.   Your baby is not gaining weight by 95 days of age. SEEK IMMEDIATE MEDICAL CARE IF:   Your baby is overly tired (lethargic) and does not want to wake up and feed.  Your baby develops an unexplained fever. Document Released: 09/11/2005 Document Revised: 09/16/2013 Document Reviewed: 03/05/2013 [facility] Patient Information 2015 [place], [place]. This information is not intended to replace advice given to you by your health care provider. Make sure you discuss any questions you have with your health care provider.  Gestational Diabetes Mellitus Gestational diabetes mellitus, often simply referred to as gestational diabetes, is a type of diabetes that some women develop during pregnancy. In gestational diabetes, the pancreas does not make enough insulin (a hormone), the cells are less responsive to the insulin that is made (insulin resistance), or both.Normally, insulin moves sugars from food into the tissue cells. The tissue cells use the sugars for energy. The lack of insulin or the lack of normal response to insulin causes excess sugars to build up in the blood instead of going into the tissue cells. As a result, high blood sugar (hyperglycemia) develops. The effect of high sugar (glucose) levels can cause many problems.  RISK FACTORS You have an increased chance of developing gestational diabetes if you have a family history of diabetes and also have one or more of the following risk factors:  A body mass index over 30 (obesity).  A previous pregnancy with gestational diabetes.  An older age at the time of pregnancy. If blood glucose levels are kept in the normal range during  pregnancy, women can have a healthy pregnancy. If your blood glucose levels are not well controlled,  there may be risks to you, your unborn baby (fetus), your labor and delivery, or your newborn baby.  SYMPTOMS  If symptoms are experienced, they are much like symptoms you would normally expect during pregnancy. The symptoms of gestational diabetes include:   Increased thirst (polydipsia).  Increased urination (polyuria).  Increased urination during the night (nocturia).  Weight loss. This weight loss may be rapid.  Frequent, recurring infections.  Tiredness (fatigue).  Weakness.  Vision changes, such as blurred vision.  Fruity smell to your breath.  Abdominal pain. DIAGNOSIS Diabetes is diagnosed when blood glucose levels are increased. Your blood glucose level may be checked by one or more of the following blood tests:  A fasting blood glucose test. You will not be allowed to eat for at least 8 hours before a blood sample is taken.  A random blood glucose test. Your blood glucose is checked at any time of the day regardless of when you ate.  A hemoglobin A1c blood glucose test. A hemoglobin A1c test provides information about blood glucose control over the previous 3 months.  An oral glucose tolerance test (OGTT). Your blood glucose is measured after you have not eaten (fasted) for 1-3 hours and then after you drink a glucose-containing beverage. Since the hormones that cause insulin resistance are highest at about 24-28 weeks of a pregnancy, an OGTT is usually performed during that time. If you have risk factors for gestational diabetes, your health care provider may test you for gestational diabetes earlier than 24 weeks of pregnancy. TREATMENT   You will need to take diabetes medicine or insulin daily to keep blood glucose levels in the desired range.  You will need to match insulin dosing with exercise and healthy food choices. The treatment goal is to maintain the  before-meal (preprandial), bedtime, and overnight blood glucose level at 60-99 mg/dL during pregnancy. The treatment goal is to further maintain peak after-meal blood sugar (postprandial glucose) level at 100-140 mg/dL. HOME CARE INSTRUCTIONS   Have your hemoglobin A1c level checked twice a year.  Perform daily blood glucose monitoring as directed by your health care provider. It is common to perform frequent blood glucose monitoring.  Monitor urine ketones when you are ill and as directed by your health care provider.  Take your diabetes medicine and insulin as directed by your health care provider to maintain your blood glucose level in the desired range.  Never run out of diabetes medicine or insulin. It is needed every day.  Adjust insulin based on your intake of carbohydrates. Carbohydrates can raise blood glucose levels but need to be included in your diet. Carbohydrates provide vitamins, minerals, and fiber which are an essential part of a healthy diet. Carbohydrates are found in fruits, vegetables, whole grains, dairy products, legumes, and foods containing added sugars.  Eat healthy foods. Alternate 3 meals with 3 snacks.  Maintain a healthy weight gain. The usual total expected weight gain varies according to your prepregnancy body mass index (BMI).  Carry a medical alert card or wear your medical alert jewelry.  Carry a 15-gram carbohydrate snack with you at all times to treat low blood glucose (hypoglycemia). Some examples of 15-gram carbohydrate snacks include:  Glucose tablets, 3 or 4.  Glucose gel, 15-gram tube.  Raisins, 2 tablespoons (24 g).  Jelly beans, 6.  Animal crackers, 8.  Fruit juice, regular soda, or low-fat milk, 4 ounces (120 mL).  Gummy treats, 9.  Recognize hypoglycemia. Hypoglycemia during pregnancy occurs with blood glucose levels  of 60 mg/dL and below. The risk for hypoglycemia increases when fasting or skipping meals, during or after intense  exercise, and during sleep. Hypoglycemia symptoms can include:  Tremors or shakes.  Decreased ability to concentrate.  Sweating.  Increased heart rate.  Headache.  Dry mouth.  Hunger.  Irritability.  Anxiety.  Restless sleep.  Altered speech or coordination.  Confusion.  Treat hypoglycemia promptly. If you are alert and able to safely swallow, follow the 15:15 rule:  Take 15-20 grams of rapid-acting glucose or carbohydrate. Rapid-acting options include glucose gel, glucose tablets, or 4 ounces (120 mL) of fruit juice, regular soda, or low-fat milk.  Check your blood glucose level 15 minutes after taking the glucose.  Take 15-20 grams more of glucose if the repeat blood glucose level is still 70 mg/dL or below.  Eat a meal or snack within 1 hour once blood glucose levels return to normal.  Be alert to polyuria (excess urination) and polydipsia (excess thirst) which are early signs of hyperglycemia. An early awareness of hyperglycemia allows for prompt treatment. Treat hyperglycemia as directed by your health care provider.  Engage in at least 30 minutes of physical activity a day or as directed by your health care provider. Ten minutes of physical activity timed 30 minutes after each meal is encouraged to control postprandial blood glucose levels.  Adjust your insulin dosing and food intake as needed if you start a new exercise or sport.  Follow your sick-day plan at any time you are unable to eat or drink as usual.  Avoid tobacco and alcohol use.  Keep all follow-up visits as directed by your health care provider.  Follow the advice of your health care provider regarding your prenatal and post-delivery (postpartum) appointments, meal planning, exercise, medicines, vitamins, blood tests, other medical tests, and physical activities.  Perform daily skin and foot care. Examine your skin and feet daily for cuts, bruises, redness, nail problems, bleeding, blisters, or  sores.  Brush your teeth and gums at least twice a day and floss at least once a day. Follow up with your dentist regularly.  Schedule an eye exam during the first trimester of your pregnancy or as directed by your health care provider.  Share your diabetes management plan with your workplace or school.  Stay up-to-date with immunizations.  Learn to manage stress.  Obtain ongoing diabetes education and support as needed.  Learn about and consider breastfeeding your baby.  You should have your blood sugar level checked 6-12 weeks after delivery. This is done with an oral glucose tolerance test (OGTT). SEEK MEDICAL CARE IF:   You are unable to eat food or drink fluids for more than 6 hours.  You have nausea and vomiting for more than 6 hours.  You have a blood glucose level of 200 mg/dL and you have ketones in your urine.  There is a change in mental status.  You develop vision problems.  You have a persistent headache.  You have upper abdominal pain or discomfort.  You develop an additional serious illness.  You have diarrhea for more than 6 hours.  You have been sick or have had a fever for a couple of days and are not getting better. SEEK IMMEDIATE MEDICAL CARE IF:   You have difficulty breathing.  You no longer feel the baby moving.  You are bleeding or have discharge from your vagina.  You start having premature contractions or labor. MAKE SURE YOU:  Understand these instructions.  Will watch  your condition.  Will get help right away if you are not doing well or get worse. Document Released: 12/18/2000 Document Revised: 01/26/2014 Document Reviewed: 04/09/2012 [facility] Patient Information 2015 [place], [place]. This information is not intended to replace advice given to you by your health care provider. Make sure you discuss any questions you have with your health care provider.

## 2014-06-29 NOTE — Progress Notes (Signed)
New diagnosis DM needs teaching today--emphasized risks of diabetes, size of baby, postnatal issues and risk of stillbirth Discussed TOLAC vs. RCS--information given--will need to sign consent if desires TOLAC Discussed circ and cost Offer flu and TDaP at next visit

## 2014-06-29 NOTE — Progress Notes (Signed)
  Patient was seen on 06/29/14 for Gestational Diabetes self-management . The following learning objectives were met by the patient :   States the definition of Gestational Diabetes  States when to check blood glucose levels  Demonstrates proper blood glucose monitoring techniques  States the effect of stress and exercise on blood glucose levels  States the importance of limiting caffeine and abstaining from alcohol and smoking  Plan:  Consider  increasing your activity level by walking daily as tolerated Begin checking BG before breakfast and 2 hours after first bit of breakfast, lunch and dinner after  as directed by MD  Take medication  as directed by MD  Blood glucose monitor given: True Test Lot # [id number] Exp: 2016/02/06 Blood glucose reading: 79  Patient instructed to monitor glucose levels: FBS: 60 - <90 2 hour: <120  Patient received the following handouts:  Nutrition Diabetes and Pregnancy  Carbohydrate Counting List  Meal Planning worksheet  Patient will be seen for follow-up as needed.

## 2014-06-29 NOTE — Progress Notes (Signed)
Nutrition note: GDM diet education Pt is a newly diagnosed GDM pt. Pt has gained 22.9# @ 759w2d, which is wnl. Pt reports eating 3 meals & 1-2 snacks/d. Pt reports she is worried her intake of dates (3-4/d) and honey (first thing in the morning with cream) are causing her BS to be elevated. Pt reports she is not physically active. Pt received verbal & written education on GDM diet. Encouraged physical activity as allowable by doctor.  Pt agrees to follow GDM diet with 3 meals & 1-3 snacks/d with proper CHO/ protein combination. F/u in 2-4 wks Blondell RevealLaura Cosette Prindle, MS, RD, LDN, [facility] HospitalBCLC

## 2014-07-06 ENCOUNTER — Ambulatory Visit (INDEPENDENT_AMBULATORY_CARE_PROVIDER_SITE_OTHER): Payer: Self-pay | Admitting: Family Medicine

## 2014-07-06 VITALS — BP 107/57 | HR 105 | Temp 98.4°F | Wt 155.4 lb

## 2014-07-06 DIAGNOSIS — O34219 Maternal care for unspecified type scar from previous cesarean delivery: Secondary | ICD-10-CM

## 2014-07-06 DIAGNOSIS — O24419 Gestational diabetes mellitus in pregnancy, unspecified control: Secondary | ICD-10-CM

## 2014-07-06 DIAGNOSIS — O0993 Supervision of high risk pregnancy, unspecified, third trimester: Secondary | ICD-10-CM

## 2014-07-06 DIAGNOSIS — O3421 Maternal care for scar from previous cesarean delivery: Secondary | ICD-10-CM

## 2014-07-06 LAB — POCT URINALYSIS DIP (DEVICE)
Bilirubin Urine: NEGATIVE
Glucose, UA: NEGATIVE mg/dL
Ketones, ur: NEGATIVE mg/dL
Nitrite: NEGATIVE
PROTEIN: NEGATIVE mg/dL
Specific Gravity, Urine: 1.025 (ref 1.005–1.030)
Urobilinogen, UA: 0.2 mg/dL (ref 0.0–1.0)
pH: 6 (ref 5.0–8.0)

## 2014-07-06 MED ORDER — GLYBURIDE 2.5 MG PO TABS: 2.5000 mg | ORAL_TABLET | Freq: Two times a day (BID) | ORAL | Status: DC

## 2014-07-06 MED ORDER — TETANUS-DIPHTH-ACELL PERTUSSIS 5-2.5-18.5 LF-MCG/0.5 IM SUSP: 0.5000 mL | Freq: Once | INTRAMUSCULAR | Status: DC

## 2014-07-06 NOTE — Progress Notes (Signed)
Fasting blood sugars - all between 90 - 105.  2hr PP - many in 130 range.  Start glyburide 2.5mg  BID. No ctxs, good fetal activity. Start NST. F/u 1 week.

## 2014-07-06 NOTE — Patient Instructions (Signed)
Third Trimester of Pregnancy The third trimester is from week 29 through week 42, months 7 through 9. This trimester is when your unborn baby (fetus) is growing very fast. At the end of the ninth month, the unborn baby is about 20 inches in length. It weighs about 6-10 pounds.  HOME CARE   Avoid all smoking, herbs, and alcohol. Avoid drugs not approved by your doctor.  Only take medicine as told by your doctor. Some medicines are safe and some are not during pregnancy.  Exercise only as told by your doctor. Stop exercising if you start having cramps.  Eat regular, healthy meals.  Wear a good support bra if your breasts are tender.  Do not use hot tubs, steam rooms, or saunas.  Wear your seat belt when driving.  Avoid raw meat, uncooked cheese, and liter boxes and soil used by cats.  Take your prenatal vitamins.  Try taking medicine that helps you poop (stool softener) as needed, and if your doctor approves. Eat more fiber by eating fresh fruit, vegetables, and whole grains. Drink enough fluids to keep your pee (urine) clear or pale yellow.  Take warm water baths (sitz baths) to soothe pain or discomfort caused by hemorrhoids. Use hemorrhoid cream if your doctor approves.  If you have puffy, bulging veins (varicose veins), wear support hose. Raise (elevate) your feet for 15 minutes, 3-4 times a day. Limit salt in your diet.  Avoid heavy lifting, wear low heels, and sit up straight.  Rest with your legs raised if you have leg cramps or low back pain.  Visit your dentist if you have not gone during your pregnancy. Use a soft toothbrush to brush your teeth. Be gentle when you floss.  You can have sex (intercourse) unless your doctor tells you not to.  Do not travel far distances unless you must. Only do so with your doctor's approval.  Take prenatal classes.  Practice driving to the hospital.  Pack your hospital bag.  Prepare the baby's room.  Go to your doctor visits. GET  HELP IF:  You are not sure if you are in labor or if your water has broken.  You are dizzy.  You have mild cramps or pressure in your lower belly (abdominal).  You have a nagging pain in your belly area.  You continue to feel sick to your stomach (nauseous), throw up (vomit), or have watery poop (diarrhea).  You have bad smelling fluid coming from your vagina.  You have pain with peeing (urination). GET HELP RIGHT AWAY IF:   You have a fever.  You are leaking fluid from your vagina.  You are spotting or bleeding from your vagina.  You have severe belly cramping or pain.  You lose or gain weight rapidly.  You have trouble catching your breath and have chest pain.  You notice sudden or extreme puffiness (swelling) of your face, hands, ankles, feet, or legs.  You have not felt the baby move in over an hour.  You have severe headaches that do not go away with medicine.  You have vision changes. Document Released: 12/06/2009 Document Revised: 01/06/2013 Document Reviewed: 11/12/2012 [employer] Patient Information 2015 [employer]. This information is not intended to replace advice given to you by your health care provider. Make sure you discuss any questions you have with your health care provider.  

## 2014-07-06 NOTE — Progress Notes (Signed)
Patient reports pelvic pressure and abdominal pain

## 2014-07-09 ENCOUNTER — Ambulatory Visit (INDEPENDENT_AMBULATORY_CARE_PROVIDER_SITE_OTHER): Payer: Self-pay | Admitting: General Practice

## 2014-07-09 VITALS — BP 106/65 | HR 97 | Wt 152.9 lb

## 2014-07-09 DIAGNOSIS — O24419 Gestational diabetes mellitus in pregnancy, unspecified control: Secondary | ICD-10-CM

## 2014-07-12 NOTE — Progress Notes (Signed)
NST reviewed and reactive.  

## 2014-07-13 ENCOUNTER — Encounter: Payer: Self-pay | Admitting: Obstetrics and Gynecology

## 2014-07-13 ENCOUNTER — Ambulatory Visit (INDEPENDENT_AMBULATORY_CARE_PROVIDER_SITE_OTHER): Payer: Self-pay | Admitting: Obstetrics and Gynecology

## 2014-07-13 VITALS — BP 113/66 | HR 93 | Wt 150.8 lb

## 2014-07-13 DIAGNOSIS — O3421 Maternal care for scar from previous cesarean delivery: Secondary | ICD-10-CM

## 2014-07-13 DIAGNOSIS — O24410 Gestational diabetes mellitus in pregnancy, diet controlled: Secondary | ICD-10-CM

## 2014-07-13 DIAGNOSIS — O0993 Supervision of high risk pregnancy, unspecified, third trimester: Secondary | ICD-10-CM

## 2014-07-13 DIAGNOSIS — O09513 Supervision of elderly primigravida, third trimester: Secondary | ICD-10-CM

## 2014-07-13 DIAGNOSIS — O34219 Maternal care for unspecified type scar from previous cesarean delivery: Secondary | ICD-10-CM

## 2014-07-13 NOTE — Progress Notes (Signed)
Patient is doing well without complaints. FM/PTL precautions reviewed. CBG- majority within range. Patient has not been taken glyburide. She took it twice at inappropriate times. Patient is not always following the diet and is often skipping meals. Reviewed diet with the patient. No glyburide for now. Will review CBGs next visit. Patient informed that she may still need to be started on glyburide at a later date.  NST reviewed and reactive [facility] Patient is considering IUD for pp contraception

## 2014-07-15 ENCOUNTER — Encounter: Payer: Self-pay | Admitting: *Deleted

## 2014-07-16 ENCOUNTER — Ambulatory Visit (INDEPENDENT_AMBULATORY_CARE_PROVIDER_SITE_OTHER): Payer: Self-pay | Admitting: *Deleted

## 2014-07-16 VITALS — BP 108/62 | HR 116 | Wt 152.4 lb

## 2014-07-16 DIAGNOSIS — Z23 Encounter for immunization: Secondary | ICD-10-CM

## 2014-07-16 DIAGNOSIS — O24410 Gestational diabetes mellitus in pregnancy, diet controlled: Secondary | ICD-10-CM

## 2014-07-16 DIAGNOSIS — O0993 Supervision of high risk pregnancy, unspecified, third trimester: Secondary | ICD-10-CM

## 2014-07-16 NOTE — Progress Notes (Signed)
Pt states she took 1/2 glyburide for elevated blood sugars.  Dr. Jolayne Pantheronstant notified for clarification of issues with medication and does the patient need 2x testing.  Dr. Jolayne Pantheronstant in to room to discuss issue with patient after reviewing sugars.  Dr. Jolayne Pantheronstant states that " we talked about this on Monday, we took you off of the medication because you did not want to take it as directed.  You are to follow the diet closely, try to eliminate the honey. If you have high sugars, you just have high sugars.  If you are not taking any diabetic medication, then you do not need to be in 2x weekly testing".  Pt verbalizes understanding.  Appointments scheduled for testing cancelled.

## 2014-07-16 NOTE — Progress Notes (Signed)
NST

## 2014-07-20 ENCOUNTER — Ambulatory Visit (INDEPENDENT_AMBULATORY_CARE_PROVIDER_SITE_OTHER): Payer: Self-pay | Admitting: Family Medicine

## 2014-07-20 VITALS — BP 106/63 | HR 102 | Temp 97.9°F | Wt 151.4 lb

## 2014-07-20 DIAGNOSIS — O34219 Maternal care for unspecified type scar from previous cesarean delivery: Secondary | ICD-10-CM

## 2014-07-20 DIAGNOSIS — O3421 Maternal care for scar from previous cesarean delivery: Secondary | ICD-10-CM

## 2014-07-20 DIAGNOSIS — O24419 Gestational diabetes mellitus in pregnancy, unspecified control: Secondary | ICD-10-CM

## 2014-07-20 DIAGNOSIS — O0993 Supervision of high risk pregnancy, unspecified, third trimester: Secondary | ICD-10-CM

## 2014-07-20 LAB — POCT URINALYSIS DIP (DEVICE)
BILIRUBIN URINE: NEGATIVE
Glucose, UA: NEGATIVE mg/dL
Hgb urine dipstick: NEGATIVE
Ketones, ur: NEGATIVE mg/dL
NITRITE: NEGATIVE
Protein, ur: NEGATIVE mg/dL
Specific Gravity, Urine: 1.025 (ref 1.005–1.030)
UROBILINOGEN UA: 0.2 mg/dL (ref 0.0–1.0)
pH: 5.5 (ref 5.0–8.0)

## 2014-07-20 NOTE — Progress Notes (Signed)
Fasting a little elevated - 90s.  Not many data points since stopping the medication and on better diet.  Will hold off on starting back on medication until seen next week. No ctxs.  Leaking some clear fluid - Fern neg.  Physiologic discharge seen.  Closed visually.

## 2014-07-20 NOTE — Progress Notes (Signed)
Patient reports a watery/clear d/c but denies water breaking, states it does have a slight smell to it. Patient also reports pain/pressure in pelvis

## 2014-07-27 ENCOUNTER — Ambulatory Visit (INDEPENDENT_AMBULATORY_CARE_PROVIDER_SITE_OTHER): Payer: Self-pay | Admitting: Family Medicine

## 2014-07-27 ENCOUNTER — Encounter: Payer: Self-pay | Admitting: Obstetrics and Gynecology

## 2014-07-27 ENCOUNTER — Other Ambulatory Visit: Payer: Self-pay | Admitting: Family Medicine

## 2014-07-27 VITALS — BP 107/59 | HR 100 | Temp 98.1°F | Wt 152.3 lb

## 2014-07-27 DIAGNOSIS — O24410 Gestational diabetes mellitus in pregnancy, diet controlled: Secondary | ICD-10-CM

## 2014-07-27 DIAGNOSIS — O0993 Supervision of high risk pregnancy, unspecified, third trimester: Secondary | ICD-10-CM

## 2014-07-27 DIAGNOSIS — O24419 Gestational diabetes mellitus in pregnancy, unspecified control: Secondary | ICD-10-CM

## 2014-07-27 LAB — POCT URINALYSIS DIP (DEVICE)
Bilirubin Urine: NEGATIVE
Glucose, UA: NEGATIVE mg/dL
Hgb urine dipstick: NEGATIVE
Ketones, ur: NEGATIVE mg/dL
Nitrite: NEGATIVE
PROTEIN: 30 mg/dL — AB
Specific Gravity, Urine: >=1.03 (ref 1.005–1.030)
UROBILINOGEN UA: 1 mg/dL (ref 0.0–1.0)
pH: 5.5 (ref 5.0–8.0)

## 2014-07-27 LAB — OB RESULTS CONSOLE GBS: STREP GROUP B AG: NEGATIVE

## 2014-07-27 LAB — OB RESULTS CONSOLE GC/CHLAMYDIA
Chlamydia: NEGATIVE
GC PROBE AMP, GENITAL: NEGATIVE

## 2014-07-27 NOTE — Progress Notes (Signed)
Patient is 38 y.o. [id number] 2620w2d A1DM previously on glyburide and then discontinued because she felt her glucose was well controlled >2 weeks ago.  +FM, denies VB; +contractions (frequently yesterday), +vaginal discharge Fasting glucose: 95-108 (7/7 >90) Postprandial: 93-150 (8/15 >120) => reluctant to go back on glyburide 2.5mg  secondary to fear of hypoglycemia and "being told different things when I come".  Discussed with Dr. Shawnie PonsPratt, advised 2.5mg  BID but pt again reluctant, will restart glyburide 2.5mg  qAM and reevaluate in one at which point pt will likely need to be on BID - GBS, G/C collected today, vaginal discharge normal - start biweekly NST secondary to A2DM, previously declined/noncompliant with medications

## 2014-07-27 NOTE — Addendum Note (Signed)
Addended by: Jill SideAY, Milano Rosevear L on: 07/27/2014 12:02 PM   Modules accepted: Orders

## 2014-07-27 NOTE — Addendum Note (Signed)
Addended by: Jill SideAY, DIANE L on: 07/27/2014 11:54 AM   Modules accepted: Level of Service

## 2014-07-27 NOTE — Progress Notes (Signed)
C/o this am had water like discharge that made panties wet. C/o period like pain.

## 2014-07-28 LAB — GC/CHLAMYDIA PROBE AMP
CT Probe RNA: NEGATIVE
GC Probe RNA: NEGATIVE

## 2014-07-29 LAB — CULTURE, BETA STREP (GROUP B ONLY)

## 2014-07-30 ENCOUNTER — Ambulatory Visit (INDEPENDENT_AMBULATORY_CARE_PROVIDER_SITE_OTHER): Payer: Self-pay | Admitting: *Deleted

## 2014-07-30 VITALS — BP 103/60 | HR 100 | Wt 153.2 lb

## 2014-07-30 DIAGNOSIS — O24419 Gestational diabetes mellitus in pregnancy, unspecified control: Secondary | ICD-10-CM

## 2014-07-30 LAB — US OB FOLLOW UP

## 2014-07-30 NOTE — Progress Notes (Signed)
Patient here for nst today. States checked her blood sugar yesterday in one hand was 236 2 hours after lunch, then checked in other hand was 170 and a few minutes later 140. Advised her since she has just restarted her glyburide  1/2 tablet of 2.5 once a day to bring log with all cbg's Monday, follow diet closely and only check cbg in one hand at each time. We discussed cbg not as accurate as serum, so there may be some differences.

## 2014-07-30 NOTE — Progress Notes (Signed)
11/5 NST reviewed and reactive 

## 2014-07-30 NOTE — Progress Notes (Signed)
Pt reported further detailed information regarding her blood sugar and diet regimen with me after talking with Bonita QuinLinda, Charity fundraiserN. She stated that yesterday she did not eat much breakfast so she did not take her Glyburide as ordered. When she checked her CBG 2 hrs after lunch and it was 236, she then took her glyburide. At about 10pm she began to feel shaky and her heart was beating fast, "I felt like I needed sugar". CBG reading was 83. I asked pt what she had eaten for dinner and at what time. She said she did not eat dinner.  I explained very emphatically that she MUST eat as instructed and also to take her medication in the morning with breakfast as directed. I emphasized that she is putting her baby's health and her own at risk. I explained to her why she felt shaky and why she cannot repeat the actions of yesterday without risk of serious complications. I also stated that we need her to follow the diet and medication regimen as ordered so that we can determine if changes are necessary. When she does different things every Jarom Govan there is no way of knowing what changes are indicated in order to have optimal outcomes for her and her baby. Pt's husband was present for the entire conversation. Pt and her husband voiced understanding of information and instructions given.

## 2014-08-03 ENCOUNTER — Ambulatory Visit (INDEPENDENT_AMBULATORY_CARE_PROVIDER_SITE_OTHER): Payer: Self-pay | Admitting: Obstetrics & Gynecology

## 2014-08-03 ENCOUNTER — Encounter: Payer: Self-pay | Attending: Family Medicine | Admitting: *Deleted

## 2014-08-03 VITALS — BP 107/59 | HR 103 | Temp 97.7°F | Wt 154.1 lb

## 2014-08-03 DIAGNOSIS — Z713 Dietary counseling and surveillance: Secondary | ICD-10-CM | POA: Insufficient documentation

## 2014-08-03 DIAGNOSIS — O24419 Gestational diabetes mellitus in pregnancy, unspecified control: Secondary | ICD-10-CM

## 2014-08-03 LAB — POCT URINALYSIS DIP (DEVICE)
BILIRUBIN URINE: NEGATIVE
Glucose, UA: NEGATIVE mg/dL
Ketones, ur: NEGATIVE mg/dL
LEUKOCYTES UA: NEGATIVE
NITRITE: NEGATIVE
PROTEIN: NEGATIVE mg/dL
Specific Gravity, Urine: >=1.03 (ref 1.005–1.030)
Urobilinogen, UA: 0.2 mg/dL (ref 0.0–1.0)
pH: 5.5 (ref 5.0–8.0)

## 2014-08-03 NOTE — Progress Notes (Signed)
US for growth scheduled 11/12 @ 1115.  IOL scheduled 11/21 @ 0700.  Pt advised that female midwife is first call that Angela Strickland for vaginal delivery, however female physician would need to be involved with her care directly if there were unexpected complications or need of C/S. Pt voiced understanding and agrees to plan of care.

## 2014-08-03 NOTE — Progress Notes (Signed)
Fasting--103, 98, 106, 117, 95, 106, 117, 95, 103, 109;  Pp break 85,108,112,148,131,120; pp lunch 143,197,137,114,150,88; pp din 104,83,130,120,180,106 Pt taking the glyburinde pp breakfast.  Pt instructed to take with breakfast.  Pt states taking 1/2 tablet at night makes her feel gittery.  She feels gittery at 3880.  Pt encouraged to take 1/4 tablet at bedtime (buy pill cutter).  Pt understands risk of IUFD with GDM. Re confimred VBAC (pt reread consent she signed and agrees to all in the consent). Addendum:  After in depth conversation with Diabetes educator, Pt now agrees to take medication as prescribed.  2.5 mg with breakfast and 1.25 mg with dinner.  Pt also agrees to take bedtime snack.

## 2014-08-03 NOTE — Progress Notes (Signed)
Reviewed Nutrition and medication recommendations with patient.  Eat three meals and three snacks per day. Take Glyburide AT or before taking first bite of breakfast and dinner. After consultation with Dr. Penne LashLeggett r/T exceeding number of glucose readings outside of recommended parameters.... Patient encouraged to Continue with Glyburide 2.5mg  with Breakfast and 1.25mg  with/befor dinner. Have 1C of milk prior to bed. If patient awakens in the morning and feels jittery and "just not right" have 1C milk, slice bread and peanut butter. NOT cake. Patient verbalized depressed and tearful about need to follow these recommendations. I reinforced that the elevated numbers were harmful for her baby. I believe she will attempt to comply with recommendations.

## 2014-08-03 NOTE — Progress Notes (Signed)
Reports occasional contractions.  Declines flu and tdap.

## 2014-08-05 ENCOUNTER — Encounter: Payer: Self-pay | Admitting: *Deleted

## 2014-08-06 ENCOUNTER — Encounter: Payer: Self-pay | Admitting: *Deleted

## 2014-08-06 ENCOUNTER — Ambulatory Visit (HOSPITAL_COMMUNITY)
Admission: RE | Admit: 2014-08-06 | Discharge: 2014-08-06 | Disposition: A | Discharge status: Home / Self Care | Payer: Self-pay | Source: Ambulatory Visit | Attending: Obstetrics & Gynecology | Admitting: Obstetrics & Gynecology

## 2014-08-06 ENCOUNTER — Ambulatory Visit (INDEPENDENT_AMBULATORY_CARE_PROVIDER_SITE_OTHER): Payer: Self-pay | Admitting: *Deleted

## 2014-08-06 VITALS — BP 106/61 | HR 100

## 2014-08-06 DIAGNOSIS — O24419 Gestational diabetes mellitus in pregnancy, unspecified control: Secondary | ICD-10-CM | POA: Insufficient documentation

## 2014-08-06 DIAGNOSIS — O24410 Gestational diabetes mellitus in pregnancy, diet controlled: Secondary | ICD-10-CM

## 2014-08-06 NOTE — Progress Notes (Signed)
NST

## 2014-08-06 NOTE — Progress Notes (Signed)
NST reviewed and reactive.  Adrain Butrick L. Harraway-Smith, M.D., FACOG    

## 2014-08-10 ENCOUNTER — Ambulatory Visit (INDEPENDENT_AMBULATORY_CARE_PROVIDER_SITE_OTHER): Payer: Self-pay | Admitting: Obstetrics & Gynecology

## 2014-08-10 VITALS — BP 102/63 | HR 104 | Temp 97.9°F | Wt 154.2 lb

## 2014-08-10 DIAGNOSIS — O0993 Supervision of high risk pregnancy, unspecified, third trimester: Secondary | ICD-10-CM

## 2014-08-10 DIAGNOSIS — O34219 Maternal care for unspecified type scar from previous cesarean delivery: Secondary | ICD-10-CM

## 2014-08-10 DIAGNOSIS — O24414 Gestational diabetes mellitus in pregnancy, insulin controlled: Secondary | ICD-10-CM

## 2014-08-10 DIAGNOSIS — O3421 Maternal care for scar from previous cesarean delivery: Secondary | ICD-10-CM

## 2014-08-10 DIAGNOSIS — O24419 Gestational diabetes mellitus in pregnancy, unspecified control: Secondary | ICD-10-CM

## 2014-08-10 LAB — POCT URINALYSIS DIP (DEVICE)
Bilirubin Urine: NEGATIVE
Glucose, UA: NEGATIVE mg/dL
Hgb urine dipstick: NEGATIVE
KETONES UR: NEGATIVE mg/dL
LEUKOCYTES UA: NEGATIVE
Nitrite: NEGATIVE
PROTEIN: NEGATIVE mg/dL
Specific Gravity, Urine: >=1.03 (ref 1.005–1.030)
UROBILINOGEN UA: 0.2 mg/dL (ref 0.0–1.0)
pH: 6 (ref 5.0–8.0)

## 2014-08-10 MED ORDER — GLYBURIDE 2.5 MG PO TABS: 1.2500 mg | ORAL_TABLET | Freq: Three times a day (TID) | ORAL | Status: DC

## 2014-08-10 NOTE — Progress Notes (Signed)
08/06/14 9559w5d EFW 3836g (8 lb 7 oz)/>90%, HC >97%, AC 93%, cephalic, AFI 17.88 cm. Discussed with patient. She is concerned about size and felt she was told to go with RCS if EFW was above >8 lbs at the previous visit, , will proceed with TOLAC as planned. Aware of risk of shoulder dystocia.  On review of blood sugars; normal fastings, normal lunch values.  One abnormal breakfast and five abnormal dinner values (135-149).  Will try Glyburide 1.25 mg po tid wc. IOL scheduled 08/15/14 at 0730.  Patient advised to be NPO after midnight in case she changes her mind.  Of note, she desires BTL in the event of RCS. NST performed today was reviewed and was found to be reactive.  Continue recommended antenatal testing and prenatal care.   Labor and fetal movement precautions reviewed.

## 2014-08-10 NOTE — Patient Instructions (Signed)
Return to clinic for any obstetric concerns or go to [facility] for evaluation  

## 2014-08-13 ENCOUNTER — Encounter (HOSPITAL_COMMUNITY): Payer: Self-pay | Admitting: *Deleted

## 2014-08-13 ENCOUNTER — Telehealth (HOSPITAL_COMMUNITY): Payer: Self-pay | Admitting: *Deleted

## 2014-08-13 ENCOUNTER — Ambulatory Visit (INDEPENDENT_AMBULATORY_CARE_PROVIDER_SITE_OTHER): Payer: Self-pay | Admitting: *Deleted

## 2014-08-13 VITALS — BP 113/75 | HR 103

## 2014-08-13 DIAGNOSIS — O24419 Gestational diabetes mellitus in pregnancy, unspecified control: Secondary | ICD-10-CM

## 2014-08-13 NOTE — Telephone Encounter (Signed)
Interpreter number [phone]

## 2014-08-13 NOTE — Telephone Encounter (Signed)
Preadmission screen  

## 2014-08-13 NOTE — Progress Notes (Signed)
IOL on 11/21 @ 0700

## 2014-08-13 NOTE — Progress Notes (Signed)
NST performed today was reviewed and was found to be reactive.  Continue recommended antenatal testing and prenatal care.  

## 2014-08-15 ENCOUNTER — Inpatient Hospital Stay (HOSPITAL_COMMUNITY)
Admission: RE | Admit: 2014-08-15 | Discharge: 2014-08-15 | Disposition: A | Discharge status: Home / Self Care | Payer: Self-pay | Source: Ambulatory Visit | Attending: Obstetrics & Gynecology | Admitting: Obstetrics & Gynecology

## 2014-08-15 ENCOUNTER — Inpatient Hospital Stay (HOSPITAL_COMMUNITY)
Admission: RE | Admit: 2014-08-15 | Discharge: 2014-08-17 | DRG: 775 | Disposition: A | Discharge status: Home / Self Care | Payer: Self-pay | Source: Ambulatory Visit | Attending: Obstetrics and Gynecology | Admitting: Obstetrics and Gynecology

## 2014-08-15 ENCOUNTER — Encounter (HOSPITAL_COMMUNITY): Payer: Self-pay | Admitting: *Deleted

## 2014-08-15 ENCOUNTER — Inpatient Hospital Stay (HOSPITAL_COMMUNITY): Payer: Self-pay | Admitting: Anesthesiology

## 2014-08-15 DIAGNOSIS — O3421 Maternal care for scar from previous cesarean delivery: Secondary | ICD-10-CM | POA: Diagnosis present

## 2014-08-15 DIAGNOSIS — O24429 Gestational diabetes mellitus in childbirth, unspecified control: Principal | ICD-10-CM | POA: Diagnosis present

## 2014-08-15 DIAGNOSIS — O34219 Maternal care for unspecified type scar from previous cesarean delivery: Secondary | ICD-10-CM

## 2014-08-15 DIAGNOSIS — O9902 Anemia complicating childbirth: Secondary | ICD-10-CM | POA: Diagnosis present

## 2014-08-15 DIAGNOSIS — Z8632 Personal history of gestational diabetes: Secondary | ICD-10-CM | POA: Diagnosis present

## 2014-08-15 DIAGNOSIS — Z3A39 39 weeks gestation of pregnancy: Secondary | ICD-10-CM | POA: Diagnosis present

## 2014-08-15 DIAGNOSIS — O09523 Supervision of elderly multigravida, third trimester: Secondary | ICD-10-CM

## 2014-08-15 DIAGNOSIS — O09513 Supervision of elderly primigravida, third trimester: Secondary | ICD-10-CM

## 2014-08-15 LAB — CBC
HCT: 29.9 % — ABNORMAL LOW (ref 36.0–46.0)
HCT: 26.9 % — ABNORMAL LOW (ref 36.0–46.0)
Hemoglobin: 9.6 g/dL — ABNORMAL LOW (ref 12.0–15.0)
Hemoglobin: 8.8 g/dL — ABNORMAL LOW (ref 12.0–15.0)
MCH: 25.2 pg — ABNORMAL LOW (ref 26.0–34.0)
MCH: 25.7 pg — AB (ref 26.0–34.0)
MCHC: 32.1 g/dL (ref 30.0–36.0)
MCHC: 32.7 g/dL (ref 30.0–36.0)
MCV: 78.5 fL (ref 78.0–100.0)
MCV: 78.4 fL (ref 78.0–100.0)
PLATELETS: 107 10*3/uL — AB (ref 150–400)
Platelets: 110 10*3/uL — ABNORMAL LOW (ref 150–400)
RBC: 3.81 MIL/uL — ABNORMAL LOW (ref 3.87–5.11)
RBC: 3.43 MIL/uL — ABNORMAL LOW (ref 3.87–5.11)
RDW: 15.3 % (ref 11.5–15.5)
RDW: 15.3 % (ref 11.5–15.5)
WBC: 5.9 10*3/uL (ref 4.0–10.5)
WBC: 11.5 10*3/uL — ABNORMAL HIGH (ref 4.0–10.5)

## 2014-08-15 LAB — TYPE AND SCREEN
ABO/RH(D): O POS
Antibody Screen: NEGATIVE

## 2014-08-15 LAB — GLUCOSE, CAPILLARY
GLUCOSE-CAPILLARY: 99 mg/dL (ref 70–99)
Glucose-Capillary: 89 mg/dL (ref 70–99)

## 2014-08-15 LAB — RPR

## 2014-08-15 LAB — HIV ANTIBODY (ROUTINE TESTING W REFLEX): HIV 1&2 Ab, 4th Generation: NONREACTIVE

## 2014-08-15 MED ORDER — FENTANYL 2.5 MCG/ML BUPIVACAINE 1/10 % EPIDURAL INFUSION (WH - ANES)
14.0000 mL/h | INTRAMUSCULAR | Status: DC | PRN
  Administered 2014-08-15: 14 mL/h via EPIDURAL
  Filled 2014-08-15: qty 125

## 2014-08-15 MED ORDER — EPHEDRINE 5 MG/ML INJ
10.0000 mg | INTRAVENOUS | Status: DC | PRN
  Filled 2014-08-15: qty 2
  Filled 2014-08-15: qty 4

## 2014-08-15 MED ORDER — OXYTOCIN 40 UNITS IN LACTATED RINGERS INFUSION - SIMPLE MED
1.0000 m[IU]/min | INTRAVENOUS | Status: DC
  Administered 2014-08-15: 2 m[IU]/min via INTRAVENOUS
  Filled 2014-08-15: qty 1000

## 2014-08-15 MED ORDER — LACTATED RINGERS IV SOLN
INTRAVENOUS | Status: DC
  Administered 2014-08-15: 125 mL/h via INTRAVENOUS
  Administered 2014-08-15: 09:00:00 via INTRAVENOUS

## 2014-08-15 MED ORDER — PHENYLEPHRINE 40 MCG/ML (10ML) SYRINGE FOR IV PUSH (FOR BLOOD PRESSURE SUPPORT)
80.0000 ug | PREFILLED_SYRINGE | INTRAVENOUS | Status: DC | PRN
  Filled 2014-08-15: qty 2

## 2014-08-15 MED ORDER — LACTATED RINGERS IV SOLN: 500.0000 mL | Freq: Once | INTRAVENOUS | Status: DC

## 2014-08-15 MED ORDER — OXYCODONE-ACETAMINOPHEN 5-325 MG PO TABS: 1.0000 | ORAL_TABLET | ORAL | Status: DC | PRN

## 2014-08-15 MED ORDER — LIDOCAINE-EPINEPHRINE (PF) 2 %-1:200000 IJ SOLN
INTRAMUSCULAR | Status: DC | PRN
  Administered 2014-08-15: 3 mL

## 2014-08-15 MED ORDER — PHENYLEPHRINE 40 MCG/ML (10ML) SYRINGE FOR IV PUSH (FOR BLOOD PRESSURE SUPPORT)
80.0000 ug | PREFILLED_SYRINGE | INTRAVENOUS | Status: DC | PRN
  Filled 2014-08-15: qty 10
  Filled 2014-08-15: qty 2

## 2014-08-15 MED ORDER — LACTATED RINGERS IV SOLN
500.0000 mL | INTRAVENOUS | Status: DC | PRN
  Administered 2014-08-15 (×2): 1000 mL via INTRAVENOUS

## 2014-08-15 MED ORDER — CITRIC ACID-SODIUM CITRATE 334-500 MG/5ML PO SOLN
30.0000 mL | ORAL | Status: DC | PRN
Start: 2014-08-15 — End: 2014-08-16

## 2014-08-15 MED ORDER — ACETAMINOPHEN 325 MG PO TABS
650.0000 mg | ORAL_TABLET | ORAL | Status: DC | PRN
  Administered 2014-08-15: 650 mg via ORAL
  Filled 2014-08-15: qty 2

## 2014-08-15 MED ORDER — EPHEDRINE 5 MG/ML INJ
10.0000 mg | INTRAVENOUS | Status: DC | PRN
Start: 2014-08-15 — End: 2014-08-16
  Filled 2014-08-15: qty 2

## 2014-08-15 MED ORDER — IBUPROFEN 600 MG PO TABS
600.0000 mg | ORAL_TABLET | Freq: Four times a day (QID) | ORAL | Status: DC
  Administered 2014-08-15 – 2014-08-16 (×2): 600 mg via ORAL
  Filled 2014-08-15 (×3): qty 1

## 2014-08-15 MED ORDER — ONDANSETRON HCL 4 MG/2ML IJ SOLN: 4.0000 mg | Freq: Four times a day (QID) | INTRAMUSCULAR | Status: DC | PRN

## 2014-08-15 MED ORDER — TERBUTALINE SULFATE 1 MG/ML IJ SOLN: 0.2500 mg | Freq: Once | INTRAMUSCULAR | Status: AC | PRN

## 2014-08-15 MED ORDER — BUPIVACAINE HCL (PF) 0.25 % IJ SOLN
INTRAMUSCULAR | Status: DC | PRN
  Administered 2014-08-15 (×2): 4 mL via EPIDURAL

## 2014-08-15 MED ORDER — FENTANYL 2.5 MCG/ML BUPIVACAINE 1/10 % EPIDURAL INFUSION (WH - ANES)
INTRAMUSCULAR | Status: DC | PRN
  Administered 2014-08-15: 12 mL/h via EPIDURAL

## 2014-08-15 MED ORDER — LIDOCAINE HCL (PF) 1 % IJ SOLN
30.0000 mL | INTRAMUSCULAR | Status: DC | PRN
  Filled 2014-08-15: qty 30

## 2014-08-15 MED ORDER — OXYCODONE-ACETAMINOPHEN 5-325 MG PO TABS: 2.0000 | ORAL_TABLET | ORAL | Status: DC | PRN

## 2014-08-15 MED ORDER — OXYTOCIN BOLUS FROM INFUSION
500.0000 mL | INTRAVENOUS | Status: DC
  Administered 2014-08-15: 500 mL via INTRAVENOUS

## 2014-08-15 MED ORDER — DIPHENHYDRAMINE HCL 50 MG/ML IJ SOLN: 12.5000 mg | INTRAMUSCULAR | Status: DC | PRN

## 2014-08-15 MED ORDER — OXYTOCIN 40 UNITS IN LACTATED RINGERS INFUSION - SIMPLE MED
62.5000 mL/h | INTRAVENOUS | Status: DC
  Administered 2014-08-15: 62.5 mL/h via INTRAVENOUS

## 2014-08-15 NOTE — Progress Notes (Signed)
Patient ID: Angela SilviusHana Sissel, female   DOB: 06/24/1976, 38 y.o.   MRN: [id number]  S:   38 y.o. [id number] on PPD#0 following NSVD. Notified by RN of pt report of chest tightness when standing up or sitting on edge of bed.  Symptom resolves when pt in bed.  EBL during delivery 500 ml.    O: BP 127/71 mmHg  Pulse 107  Temp(Src) 98.7 F (37.1 C) (Oral)  Resp 18  Ht 5' 2.99" (1.6 m)  Wt 70.308 kg (155 lb)  BMI 27.46 kg/m2  SpO2 96%  LMP 11/15/2013  Breastfeeding? Unknown   Results for orders placed or performed during the hospital encounter of 08/15/14 (from the past 24 hour(s))  CBC     Status: Abnormal   Collection Time: 08/15/14  9:05 AM  Result Value Ref Range   WBC 5.9 4.0 - 10.5 K/uL   RBC 3.81 (L) 3.87 - 5.11 MIL/uL   Hemoglobin 9.6 (L) 12.0 - 15.0 g/dL   HCT 29.529.9 (L) 62.136.0 - 30.846.0 %   MCV 78.5 78.0 - 100.0 fL   MCH 25.2 (L) 26.0 - 34.0 pg   MCHC 32.1 30.0 - 36.0 g/dL   RDW 65.715.3 84.611.5 - 96.215.5 %   Platelets 107 (L) 150 - 400 K/uL  CBC     Status: Abnormal   Collection Time: 08/15/14  9:20 PM  Result Value Ref Range   WBC 11.5 (H) 4.0 - 10.5 K/uL   RBC 3.43 (L) 3.87 - 5.11 MIL/uL   Hemoglobin 8.8 (L) 12.0 - 15.0 g/dL   HCT 95.226.9 (L) 84.136.0 - 32.446.0 %   MCV 78.4 78.0 - 100.0 fL   MCH 25.7 (L) 26.0 - 34.0 pg   MCHC 32.7 30.0 - 36.0 g/dL   RDW 40.115.3 02.711.5 - 25.315.5 %   Platelets 110 (L) 150 - 400 K/uL   Physical Examination: General appearance - alert, well appearing, and in no distress and fatigued with some pale skin color Chest - clear to auscultation, no wheezes, rales or rhonchi, symmetric air entry Heart - normal rate, regular rhythm, normal S1, S2, no murmurs, rubs, clicks or gallops  Lochia wnl currently with firm fundus.     A: Symptomatic anemia postpartum  Symptoms improved with IV fluids   P: LR x 1000 ml ordered prior to pt getting up again and transferring to mother baby unit.  Pt reports no chest symptoms after IV fluids but does report fatigue.  Ok to transfer to  mother baby unit and will reevaluate symptoms in a few hours Pt instructed to call for assistance during the night if she needs to get out of bed Notified pt and RN to call CNM with any concerns   Sharen CounterLisa Leftwich-Kirby Certified Nurse-Midwife

## 2014-08-15 NOTE — Progress Notes (Signed)
Patient ID: Gilford SilviusHana Baxendale, female   DOB: 13-Feb-1976, 38 y.o.   MRN: [id number] Comfortable with epidural  Filed Vitals:   08/15/14 1530 08/15/14 1601 08/15/14 1630 08/15/14 1700  BP: 113/71 107/55 106/62 108/62  Pulse: 79 83 80 81  Temp:    98 F (36.7 C)  TempSrc:    Oral  Resp: 18 20 18 20   Height:      Weight:      SpO2:        FHR stable UCs every 2 min  Dilation: 4.5 Effacement (%): 70 Cervical Position: Middle Station: -2 Presentation: Vertex Exam by:: Valentina Lucks. Woods, RN  Continue to observe

## 2014-08-15 NOTE — Progress Notes (Signed)
Patient ID: Gilford SilviusHana Strickland, female   DOB: 1976/06/03, 38 y.o.   MRN: [id number] Foley Placed  Cervix 1+/60/-2/vertex/soft

## 2014-08-15 NOTE — H&P (Signed)
Angela SilviusHana Herrmann is a 38 y.o. female presenting for Induction of labor for Gestational Diabetes (controlled with Glyburide).  Has had moderately well controlled sugars.  Most of the elevations have been related to improper medicine taking. States FBS was 91 today.   Has had 2 SVDs and one C/S following those.   Maternal Medical History:  Reason for admission: Nausea.    OB History    Gravida Para Term Preterm AB TAB SAB Ectopic Multiple Living   5 3 2 1 1  0 1 0 0 3     Past Medical History  Diagnosis Date  . Blood transfusion without reported diagnosis     with c/s  . Headache(784.0)   . Infection     UTI  . Degenerative disk disease    Past Surgical History  Procedure Laterality Date  . Cesarean section    . Hernia repair     Family History: family history includes Cancer in her father; Diabetes in her mother; Hearing loss in her other. Social History:  reports that she has never smoked. She has never used smokeless tobacco. She reports that she does not drink alcohol or use illicit drugs.   Review of Systems  Constitutional: Negative for fever, chills and malaise/fatigue.  Gastrointestinal: Negative for nausea, vomiting and abdominal pain.  Neurological: Negative for dizziness, weakness and headaches.      Height 5' 2.99" (1.6 m), weight 155 lb (70.308 kg), last menstrual period 11/15/2013. Maternal Exam:  Uterine Assessment: Contraction strength is mild.  Contraction frequency is rare.   Abdomen: Surgical scars: low transverse.   Fundal height is 39.   Estimated fetal weight is 7.5.   Fetal presentation: vertex  Introitus: Normal vulva. Normal vagina.  Vagina is negative for discharge.  Ferning test: not done.  Nitrazine test: not done. Amniotic fluid character: not assessed.  Pelvis: adequate for delivery.   Cervix: Cervix evaluated by digital exam.     Fetal Exam Fetal Monitor Review: Mode: ultrasound.   Baseline rate: 140.  Variability: moderate (6-25 bpm).    Pattern: accelerations present and no decelerations.    Fetal State Assessment: Category I - tracings are normal.     Physical Exam  Constitutional: She is oriented to person, place, and time. She appears well-developed and well-nourished. No distress.  HENT:  Head: Normocephalic.  Cardiovascular: Normal rate, regular rhythm and normal heart sounds.  Exam reveals no gallop and no friction rub.   No murmur heard. Respiratory: Effort normal and breath sounds normal. No respiratory distress. She has no wheezes. She has no rales. She exhibits no tenderness.  GI: Soft. She exhibits no distension and no mass. There is no tenderness. There is no rebound and no guarding.  Genitourinary: Vagina normal. No vaginal discharge found.  Cervix last 1/50/-3.  Musculoskeletal: Normal range of motion.  Neurological: She is alert and oriented to person, place, and time.  Skin: Skin is warm and dry.  Psychiatric: She has a normal mood and affect.    Prenatal labs: ABO, Rh: O/POS/-- (09/21 1450) Antibody: NEG (09/21 1450) Rubella: 2.10 (09/21 1450) RPR: NON REAC (09/21 1450)  HBsAg: NEGATIVE (09/21 1450)  HIV: NONREACTIVE (09/21 1450)  GBS: Negative (11/02 0000)   Assessment/Plan: A:  SIUP at 5754w0d       GDM requiring Glyburide      Induction of Labor  P:  [facility]      Routine orders      WIll recommend Foley and Pitocin  [facility] Hospital[facility]ILLIAMS,Ellery Tash 08/15/2014, 9:51 AM

## 2014-08-15 NOTE — Progress Notes (Signed)
Bonna GainsL. Leftwich-Kirby, CNM notified of pt c/o chest tightness upon sitting to get up out of bed. Vitals WNL; O2 sat 100%. Bleeding WNL and uterus firm at midline. Pt states tightness eased after being laid back in bed. New orders received. Will continue to monitor.

## 2014-08-15 NOTE — Anesthesia Procedure Notes (Signed)
Epidural Patient location during procedure: OB  Staffing Anesthesiologist: Lashonne Shull, CHRIS Performed by: anesthesiologist   Preanesthetic Checklist Completed: patient identified, surgical consent, pre-op evaluation, timeout performed, IV checked, risks and benefits discussed and monitors and equipment checked  Epidural Patient position: sitting Prep: site prepped and draped and DuraPrep Patient monitoring: heart rate, cardiac monitor, continuous pulse ox and blood pressure Approach: midline Location: L3-L4 Injection technique: LOR saline  Needle:  Needle type: Tuohy  Needle gauge: 17 G Needle length: 9 cm Needle insertion depth: 6 cm Catheter type: closed end flexible Catheter size: 19 Gauge Catheter at skin depth: 12 cm Test dose: negative and 2% lidocaine with Epi 1:200 K  Assessment Events: blood not aspirated, injection not painful, no injection resistance, negative IV test and no paresthesia  Additional Notes H+P and labs checked, risks and benefits discussed with the patient, consent obtained, procedure tolerated well and without complications.  Reason for block:procedure for pain   

## 2014-08-15 NOTE — Progress Notes (Signed)
Patient ID: Angela SilviusHana Strickland, female   DOB: 03-24-1976, 38 y.o.   MRN: [id number] Starting to feel UCs somewhat  Filed Vitals:   08/15/14 1441 08/15/14 1445 08/15/14 1446 08/15/14 1500  BP:  114/50  112/70  Pulse: 84 79 89 88  Temp:      TempSrc:      Resp:  18    Height:      Weight:      SpO2: 98%  96%    CBG (last 3)   Recent Labs  08/15/14 0918 08/15/14 1216  GLUCAP 89 99    FHR reassuring UCs every 2-4 minutes  Dilation: 4.5 Effacement (%): 70 Cervical Position: Middle Station: -2 Presentation: Vertex Exam by:: Valentina Lucks. Woods, RN  Will continue to observe

## 2014-08-15 NOTE — Anesthesia Preprocedure Evaluation (Signed)
Anesthesia Evaluation  Patient identified by MRN, date of birth, ID band Patient awake    Reviewed: Allergy & Precautions, H&P , NPO status , Patient's Chart, lab work & pertinent test results  History of Anesthesia Complications Negative for: history of anesthetic complications  Airway Mallampati: II  TM Distance: >3 FB Neck ROM: Full    Dental  (+) Teeth Intact   Pulmonary  breath sounds clear to auscultation        Cardiovascular negative cardio ROS  Rhythm:Regular     Neuro/Psych  Headaches, negative psych ROS   GI/Hepatic negative GI ROS, Neg liver ROS,   Endo/Other  diabetes, Gestational  Renal/GU negative Renal ROS     Musculoskeletal   Abdominal   Peds  Hematology   Anesthesia Other Findings   Reproductive/Obstetrics                             Anesthesia Physical Anesthesia Plan  ASA: II  Anesthesia Plan: Epidural   Post-op Pain Management:    Induction:   Airway Management Planned:   Additional Equipment:   Intra-op Plan:   Post-operative Plan:   Informed Consent: I have reviewed the patients History and Physical, chart, labs and discussed the procedure including the risks, benefits and alternatives for the proposed anesthesia with the patient or authorized representative who has indicated his/her understanding and acceptance.   Dental advisory given  Plan Discussed with: Anesthesiologist  Anesthesia Plan Comments:         Anesthesia Quick Evaluation

## 2014-08-16 LAB — CBC
HCT: 22.7 % — ABNORMAL LOW (ref 36.0–46.0)
HEMOGLOBIN: 7.5 g/dL — AB (ref 12.0–15.0)
MCH: 25.7 pg — AB (ref 26.0–34.0)
MCHC: 33 g/dL (ref 30.0–36.0)
MCV: 77.7 fL — ABNORMAL LOW (ref 78.0–100.0)
PLATELETS: 89 10*3/uL — AB (ref 150–400)
RBC: 2.92 MIL/uL — ABNORMAL LOW (ref 3.87–5.11)
RDW: 15.3 % (ref 11.5–15.5)
WBC: 8.6 10*3/uL (ref 4.0–10.5)

## 2014-08-16 LAB — GLUCOSE, CAPILLARY: GLUCOSE-CAPILLARY: 97 mg/dL (ref 70–99)

## 2014-08-16 MED ORDER — ZOLPIDEM TARTRATE 5 MG PO TABS: 5.0000 mg | ORAL_TABLET | Freq: Every evening | ORAL | Status: DC | PRN

## 2014-08-16 MED ORDER — DIBUCAINE 1 % RE OINT: 1.0000 "application " | TOPICAL_OINTMENT | RECTAL | Status: DC | PRN

## 2014-08-16 MED ORDER — SIMETHICONE 80 MG PO CHEW: 80.0000 mg | CHEWABLE_TABLET | ORAL | Status: DC | PRN

## 2014-08-16 MED ORDER — OXYCODONE-ACETAMINOPHEN 5-325 MG PO TABS
2.0000 | ORAL_TABLET | ORAL | Status: DC | PRN
  Administered 2014-08-16: 2 via ORAL
  Filled 2014-08-16: qty 2

## 2014-08-16 MED ORDER — SENNOSIDES-DOCUSATE SODIUM 8.6-50 MG PO TABS
2.0000 | ORAL_TABLET | ORAL | Status: DC
  Administered 2014-08-16: 2 via ORAL
  Filled 2014-08-16: qty 2

## 2014-08-16 MED ORDER — TETANUS-DIPHTH-ACELL PERTUSSIS 5-2.5-18.5 LF-MCG/0.5 IM SUSP: 0.5000 mL | Freq: Once | INTRAMUSCULAR | Status: DC

## 2014-08-16 MED ORDER — DIPHENHYDRAMINE HCL 25 MG PO CAPS: 25.0000 mg | ORAL_CAPSULE | Freq: Four times a day (QID) | ORAL | Status: DC | PRN

## 2014-08-16 MED ORDER — OXYCODONE-ACETAMINOPHEN 5-325 MG PO TABS: 1.0000 | ORAL_TABLET | ORAL | Status: DC | PRN

## 2014-08-16 MED ORDER — BENZOCAINE-MENTHOL 20-0.5 % EX AERO
1.0000 "application " | INHALATION_SPRAY | CUTANEOUS | Status: DC | PRN
  Filled 2014-08-16 (×2): qty 56

## 2014-08-16 MED ORDER — PRENATAL MULTIVITAMIN CH
1.0000 | ORAL_TABLET | Freq: Every day | ORAL | Status: DC
  Administered 2014-08-16: 1 via ORAL
  Filled 2014-08-16: qty 1

## 2014-08-16 MED ORDER — ONDANSETRON HCL 4 MG/2ML IJ SOLN: 4.0000 mg | INTRAMUSCULAR | Status: DC | PRN

## 2014-08-16 MED ORDER — WITCH HAZEL-GLYCERIN EX PADS: 1.0000 "application " | MEDICATED_PAD | CUTANEOUS | Status: DC | PRN

## 2014-08-16 MED ORDER — LANOLIN HYDROUS EX OINT: TOPICAL_OINTMENT | CUTANEOUS | Status: DC | PRN

## 2014-08-16 MED ORDER — ONDANSETRON HCL 4 MG PO TABS: 4.0000 mg | ORAL_TABLET | ORAL | Status: DC | PRN

## 2014-08-16 NOTE — Progress Notes (Signed)
Post Partum Day 1 Subjective: no complaints, up ad lib, voiding, tolerating PO and noting some vulvar pain  Objective: Blood pressure 93/53, pulse 105, temperature 98.4 F (36.9 C), temperature source Oral, resp. rate 18, height 5' 2.99" (1.6 m), weight 70.308 kg (155 lb), last menstrual period 11/15/2013, SpO2 99 %, unknown if currently breastfeeding.  Physical Exam:  General: alert, cooperative and no distress Lochia: appropriate Uterine Fundus: firm at u-2 Incision: no significant drainage, no dehiscence DVT Evaluation: No evidence of DVT seen on physical exam.   Recent Labs  08/15/14 2120 08/16/14 0610  HGB 8.8* 7.5*  HCT 26.9* 22.7*    Assessment/Plan: S/p 3 rd degree laceration from svd, successful VBAC  Plan for discharge tomorrow, Breastfeeding and Contraception IUD  Will d.c on stool softener x 30 d   LOS: 1 day   Angela Strickland V 08/16/2014, 7:49 AM

## 2014-08-16 NOTE — Progress Notes (Signed)
Patient up to bathroom and extremely dizzy.  Able to ambulate back to bed; however, dizziness worsened. CNM notified and informed would obtain orthostatics. CBG obtained 97.

## 2014-08-16 NOTE — Anesthesia Postprocedure Evaluation (Signed)
  Anesthesia Post-op Note  Anesthesia Post Note  Patient: Angela Strickland  Procedure(s) Performed: * No procedures listed *  Anesthesia type: Epidural  Patient location: Mother/Baby  Post pain: Pain level controlled  Post assessment: Post-op Vital signs reviewed  Last Vitals:  Filed Vitals:   08/16/14 0545  BP: 93/53  Pulse: 105  Temp: 36.9 C  Resp: 18    Post vital signs: Reviewed  Level of consciousness:alert  Complications: No apparent anesthesia complications

## 2014-08-16 NOTE — Plan of Care (Signed)
Problem: Phase I Progression Outcomes Goal: Pain controlled with appropriate interventions Outcome: Completed/Met Date Met:  08/16/14 Goal: OOB as tolerated unless otherwise ordered Outcome: Completed/Met Date Met:  08/16/14 Goal: VS, stable, temp < 100.4 degrees F Outcome: Completed/Met Date Met:  08/16/14 Goal: Initial discharge plan identified Outcome: Completed/Met Date Met:  08/16/14

## 2014-08-16 NOTE — Progress Notes (Signed)
Clinical Social Work Department PSYCHOSOCIAL ASSESSMENT - MATERNAL/CHILD 19-Jul-2014  Patient:  Angela Strickland  Account Number:  [id number]  Admit Date:  2013/11/22  Ardine Eng Name:   undecided at time of CSW visit    Clinical Social Worker:  Jeiden Daughtridge, LCSW   Date/Time:  10/24/2013 12:20 PM  Date Referred:  11-14-2013   Referral source  Central Nursery     Referred reason  [facility]   Other referral source:    I:  FAMILY / HOME ENVIRONMENT Child's legal guardian:  PARENT  Guardian - Name Guardian - Age Guardian - Address  [facility] [address] [place], [place] [address]  [place], [place]  same as above   Other household support members/support persons Other support:    II  PSYCHOSOCIAL DATA Information Source:    Occupational hygienist Employment:   Father is a Biomedical engineer resources:  Self Pay If [place]:  Provided contact information for [employer] Provided information to mother for [employer] / Grade:   Maternity Care Coordinator / Child Services Coordination / Early Interventions:  Cultural issues impacting care:    III  STRENGTHS Strengths  Supportive family/friends  Home prepared for Child (including basic supplies)  Adequate Resources   Strength comment:    IV  RISK FACTORS AND CURRENT PROBLEMS Current Problem:       V  SOCIAL WORK ASSESSMENT Acknowledged order for social work consult due to lapse in prenatal care.  Met with mother who was pleasant and receptive to social work intervention.  She is a married and have 3 other dependents ages 61, 73, and 30.   Mother states that after she became aware of the pregnancy, she went back to [place] for two months, and received prenatal care while in [place].  Informed that the family came to [place] from [place] two years ago for her husband to complete his PHD.   Parents are uninsured.  She denies hx of mental illness or substance abuse.  UDS on  newborn was negative.   Mother informed of the hospital's drug screen policy.   No acute social concerns noted or reported at this time.  Mother informed of social work Fish farm manager.      VI SOCIAL WORK PLAN  Type of pt/family education:   If child protective services report - county:   If child protective services report - date:   Information/referral to community resources comment:   Other social work plan:   Will continue to monitor drug screen

## 2014-08-16 NOTE — Progress Notes (Signed)
Patient ID: Gilford SilviusHana Montour, female   DOB: Feb 08, 1976, 38 y.o.   MRN: [id number] Delivery Note At 8:09 PM a viable and healthy female was delivered via VBAC, Spontaneous (Presentation: ; Occiput Anterior).  APGAR: 9, 9; weight 7 lb 13.2 oz (3549 g).  DELIVERY BY MARIE WILLIAMS, JVF PRESENT FOR DEL DUE TO DECELS Placenta status: Intact, Spontaneous.  Cord: 3 vessels with the following complications: None.  Cord pH: NOT DONE  Anesthesia: Epidural  Episiotomy: None Lacerations: 3rd degree REPAIRED BY JVF  Suture Repair: 2.0 vicryl 4 FIGURE OF EIGHT SUTURES AROUND ANAL SPHINCTER CAPSULE, TEAR WAS AT 3 OCLOCK, THEN 2 INTERRUPTED SUTURES IN PERINEAL BODY, THEN CONTINUOUS RUNNING 2-LAYER CLOSURE OF REMAINING PERINEAL BODY DEFECT WITH GOOD RECOVERY OF ANATOMY. THE PATIENT HAD OBVIOUSLY HAD SIGNIFICANT TEARING FROM PRIOR DELIVERIES, AS THERE WAS PREEXISTING PERINEAL DAMAGE AND SCARRING FROM PRIOR DELIVERIES. Est. Blood Loss (mL): 500  Mom to postpartum.  Baby to Nursery.  Ashanty Coltrane V 08/16/2014, 5:29 AM

## 2014-08-16 NOTE — Plan of Care (Signed)
Problem: Phase I Progression Outcomes Goal: Voiding adequately Outcome: Completed/Met Date Met:  08/16/14 Goal: Other Phase I Outcomes/Goals Outcome: Completed/Met Date Met:  08/16/14  Problem: Phase II Progression Outcomes Goal: Pain controlled on oral analgesia Outcome: Completed/Met Date Met:  08/16/14 Goal: Progress activity as tolerated unless otherwise ordered Outcome: Completed/Met Date Met:  08/16/14 Goal: Afebrile, VS remain stable Outcome: Completed/Met Date Met:  08/16/14 Goal: Rh isoimmunization per orders Outcome: Completed/Met Date Met:  08/16/14 Goal: Tolerating diet Outcome: Completed/Met Date Met:  08/16/14 Goal: Other Phase II Outcomes/Goals Outcome: Completed/Met Date Met:  08/16/14  Problem: Discharge Progression Outcomes Goal: Tolerating diet Outcome: Completed/Met Date Met:  08/16/14 Goal: Discharge plan in place and appropriate Outcome: Completed/Met Date Met:  08/16/14

## 2014-08-17 MED ORDER — BENZOCAINE-MENTHOL 20-0.5 % EX AERO: 1.0000 "application " | INHALATION_SPRAY | Freq: Three times a day (TID) | CUTANEOUS | Status: AC | PRN

## 2014-08-17 MED ORDER — SENNOSIDES-DOCUSATE SODIUM 8.6-50 MG PO TABS: 2.0000 | ORAL_TABLET | Freq: Every day | ORAL | Status: AC

## 2014-08-17 MED ORDER — INTEGRA F 125-1 MG PO CAPS: 1.0000 | ORAL_CAPSULE | Freq: Every day | ORAL | Status: AC

## 2014-08-17 MED ORDER — IBUPROFEN 600 MG PO TABS: 600.0000 mg | ORAL_TABLET | Freq: Four times a day (QID) | ORAL | Status: AC

## 2014-08-17 NOTE — Progress Notes (Signed)
UR chart review completed.  

## 2014-08-17 NOTE — Discharge Summary (Signed)
Obstetric Discharge Summary Reason for Admission: induction of labor for gestational diabetes.   Prenatal Procedures: NST and ultrasound Intrapartum Procedures: spontaneous vaginal delivery Postpartum Procedures: none Complications-Operative and Postpartum: 3rd degree perineal laceration HEMOGLOBIN  Date Value Ref Range Status  08/16/2014 7.5* 12.0 - 15.0 g/dL Final   HCT  Date Value Ref Range Status  08/16/2014 22.7* 36.0 - 46.0 % Final   Subjective: No problems or concerns. Patient states doing well. States dizziness is improved from yesterday.  Denies chest pain or shortness of breath.  Reports minimal bleeding and pain controlled with pain medications. Denies calf pain.  Breastfeeding.    Physical Exam:  Filed Vitals:   08/17/14 0624  BP: 107/59  Pulse: 123  Temp: 99.6 F (37.6 C)  Resp: 19    General: alert, cooperative and appears stated age; facies pale CVS:  Tachycardic, without murmur, gallops, or rubs Lungs:  CTA bilat ABD:  +BSx4, normal Lochia: appropriate Uterine Fundus: firm DVT Evaluation: No evidence of DVT seen on physical exam. Negative Homan's sign.  Discharge Diagnoses: Term Pregnancy-delivered and Anemia  Discharge Information: Date: 08/17/2014 Activity: pelvic rest Diet: routine Medications: Ibuprofen, Iron and stool softener Condition: stable Instructions: refer to practice specific booklet Discharge to: home Follow-up Information    [facility] 6 weeks.   Contact information:   [address] [place] [phone]      Newborn Data: Live born female  Birth Weight: 7 lb 13.2 oz (3549 g) APGAR: 9, 9  Home with mother.  Rochele PagesKARIM, Angela Earwood N 08/17/2014, 6:58 AM

## 2014-08-17 NOTE — Discharge Instructions (Signed)

## 2014-08-18 ENCOUNTER — Encounter: Payer: Self-pay | Admitting: Obstetrics & Gynecology

## 2014-09-30 ENCOUNTER — Ambulatory Visit (INDEPENDENT_AMBULATORY_CARE_PROVIDER_SITE_OTHER): Payer: Self-pay | Admitting: Obstetrics & Gynecology

## 2014-09-30 ENCOUNTER — Encounter: Payer: Self-pay | Admitting: Obstetrics & Gynecology

## 2014-09-30 MED ORDER — NORETHINDRONE 0.35 MG PO TABS: 1.0000 | ORAL_TABLET | Freq: Every day | ORAL | Status: AC

## 2014-09-30 NOTE — Progress Notes (Signed)
  Subjective:     Angela Strickland is a 39 y.o. M [place] female who presents for a postpartum visit. She is 6 weeks postpartum following a spontaneous vaginal delivery. VBAC I have fully reviewed the prenatal and intrapartum course. The delivery was at term gestational weeks. Outcome: spontaneous vaginal delivery. Anesthesia: epidural. Postpartum course has been uncomplicated. Baby's course has been uncomplicated. Baby is feeding by both breast and bottle - n/a. Bleeding thin lochia. Bowel function is normal. Bladder function is normal. Patient is not sexually active. Contraception method is oral progesterone-only contraceptive. Postpartum depression screening: negative.  The following portions of the patient's history were reviewed and updated as appropriate: allergies, current medications, past family history, past medical history, past social history, past surgical history and problem list.  Review of Systems A comprehensive review of systems was negative.   Objective:    BP 105/50 mmHg  Pulse 72  Temp(Src) 98.5 F (36.9 C) (Oral)  Wt 139 lb 14.4 oz (63.458 kg)  Breastfeeding? Yes  General:  alert   Breasts:  inspection negative, no nipple discharge or bleeding, no masses or nodularity palpable  Lungs: clear to auscultation bilaterally  Heart:  regular rate and rhythm, S1, S2 normal, no murmur, click, rub or gallop  Abdomen: soft, non-tender; bowel sounds normal; no masses,  no organomegaly   Vulva:  normal  Vagina: normal vagina  Cervix:  anteverted  Corpus: normal  Adnexa:  normal adnexa  Rectal Exam: Normal rectovaginal exam        Assessment:    Normal postpartum exam. Pap smear not done at today's visit.   Plan:    1. Contraception: oral progesterone-only contraceptive

## 2014-10-28 ENCOUNTER — Other Ambulatory Visit: Payer: Self-pay

## 2014-10-28 DIAGNOSIS — O24429 Gestational diabetes mellitus in childbirth, unspecified control: Secondary | ICD-10-CM

## 2014-10-29 ENCOUNTER — Encounter: Payer: Self-pay | Admitting: Obstetrics & Gynecology

## 2014-10-29 LAB — GLUCOSE TOLERANCE, 2 HOURS
Glucose, 2 hour: 99 mg/dL (ref 70–139)
Glucose, Fasting: 88 mg/dL (ref 70–99)

## 2014-10-30 ENCOUNTER — Telehealth: Payer: Self-pay | Admitting: *Deleted

## 2014-10-30 NOTE — Telephone Encounter (Signed)
Pt contacted the clinic requesting diabetes results   Contacted patient, results given and informed patient to follow up with PCP as needed.  Pt verbalizes understanding.

## 2014-11-26 ENCOUNTER — Encounter: Payer: Self-pay | Admitting: Obstetrics & Gynecology

## 2015-10-02 IMAGING — US US OB TRANSVAGINAL
1 series · 14 of 28 positions shown · non-contrast
Comparison: None.

CLINICAL DATA: Vaginal bleeding.  Positive pregnancy test.

EXAM:
OBSTETRIC <14 WK US AND TRANSVAGINAL OB US
TECHNIQUE: Both transabdominal and transvaginal ultrasound examinations were
performed for complete evaluation of the gestation as well as the
maternal uterus, adnexal regions, and pelvic cul-de-sac.
Transvaginal technique was performed to assess early pregnancy.

[Series 1: us ob comp less 14 wks · 42 acquisitions, 14 frames shown]
[im 2/42]
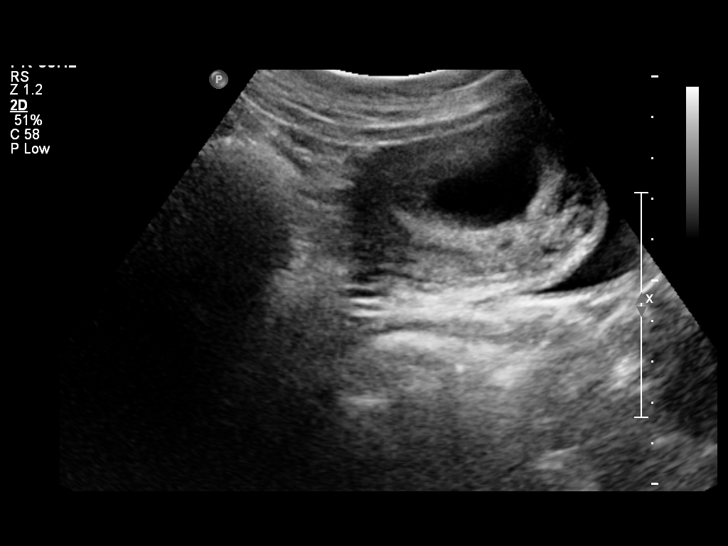
[im 5/42]
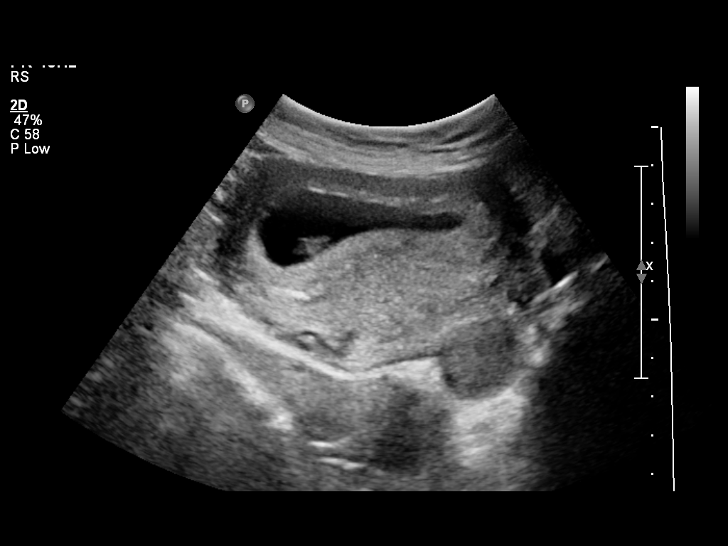
[im 8/42]
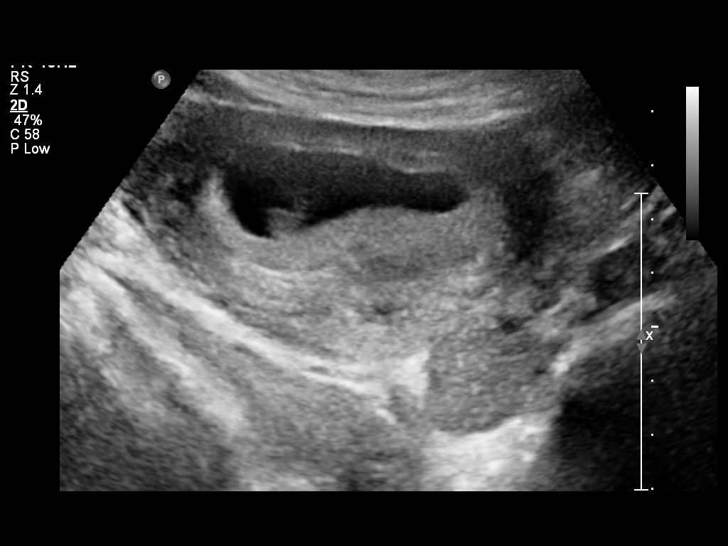
[im 11/42]
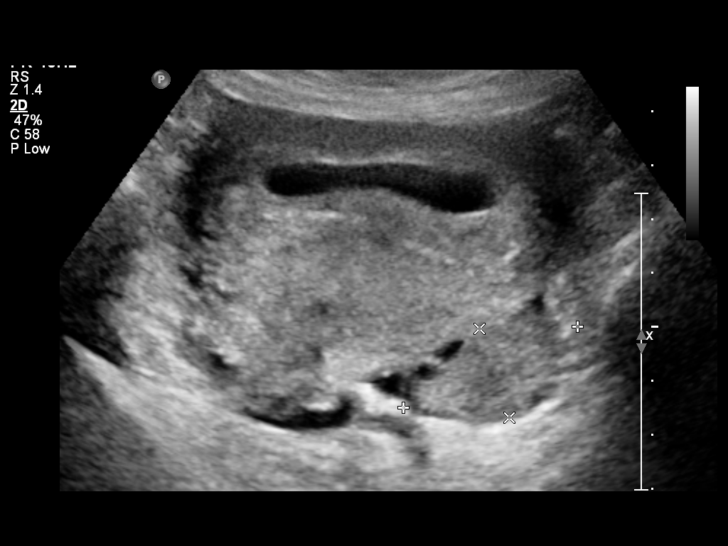
[im 14/42]
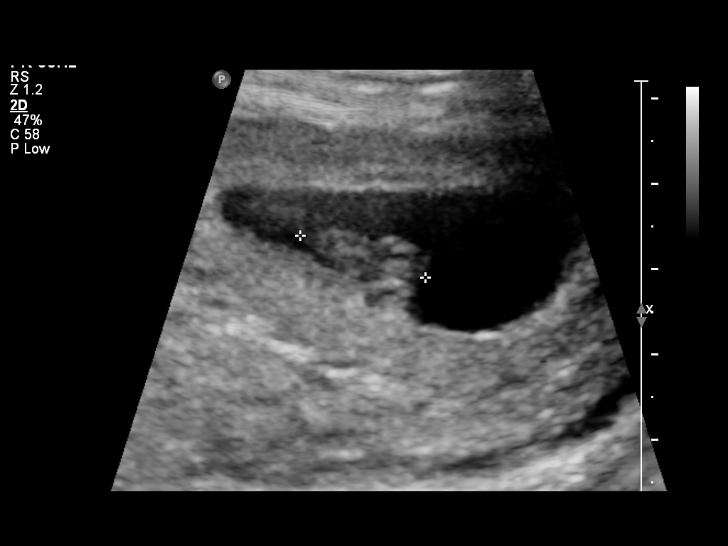
[im 17/42]
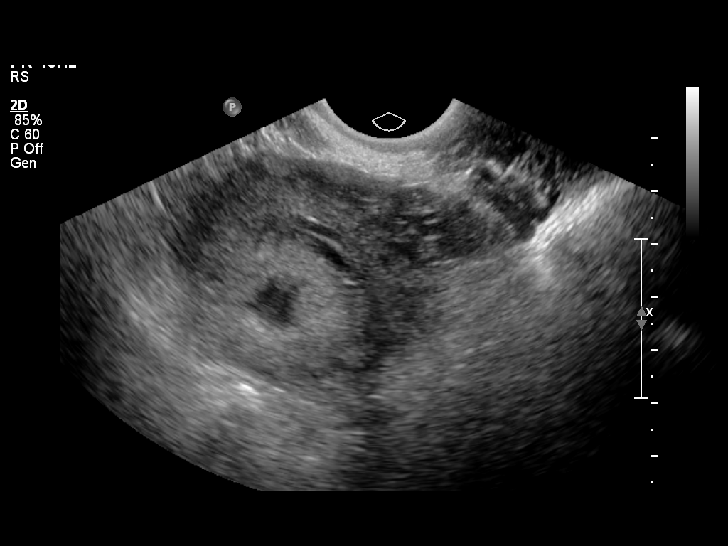
[im 20/42]
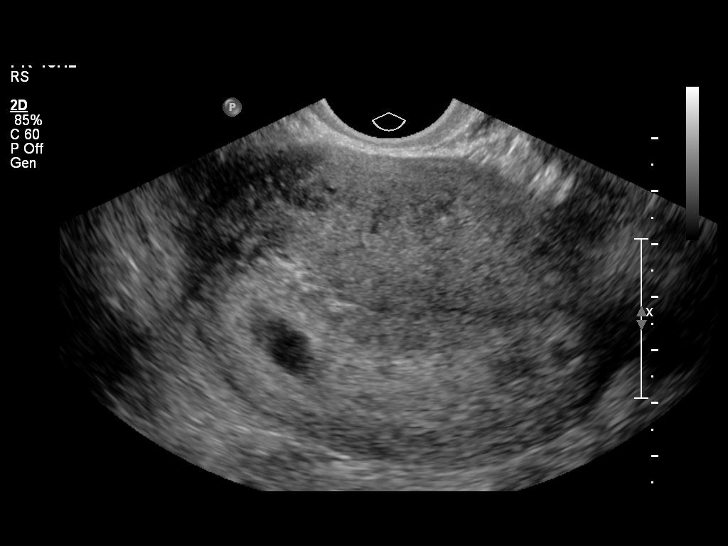
[im 23/42]
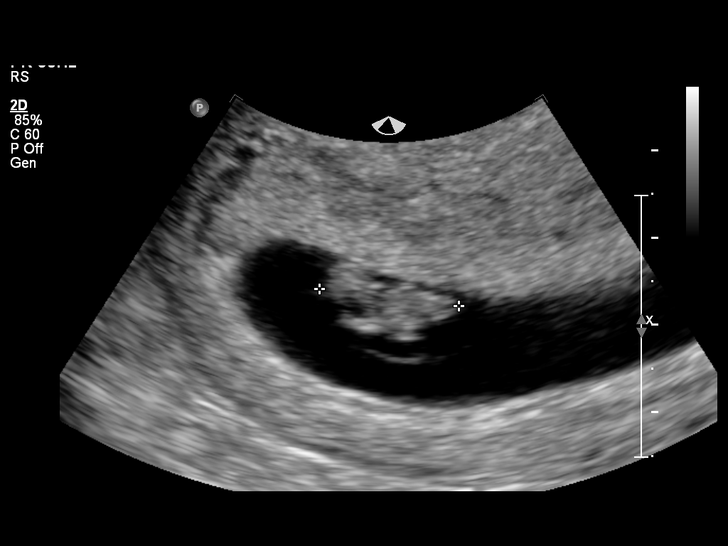
[im 26/42]
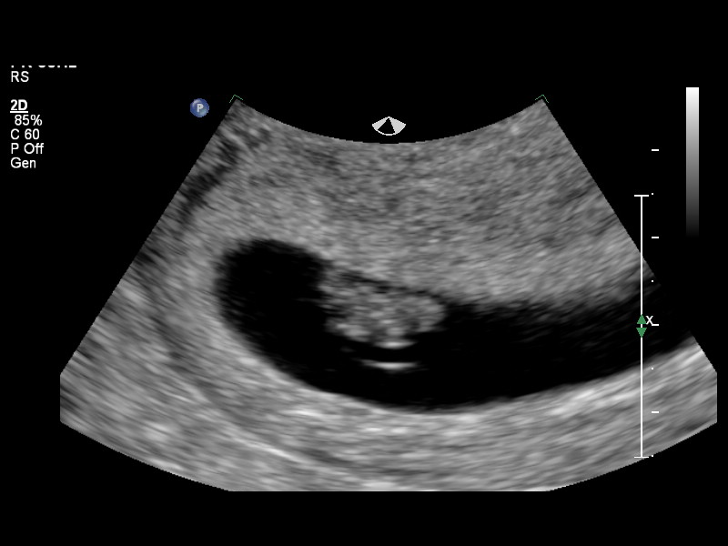
[im 29/42]
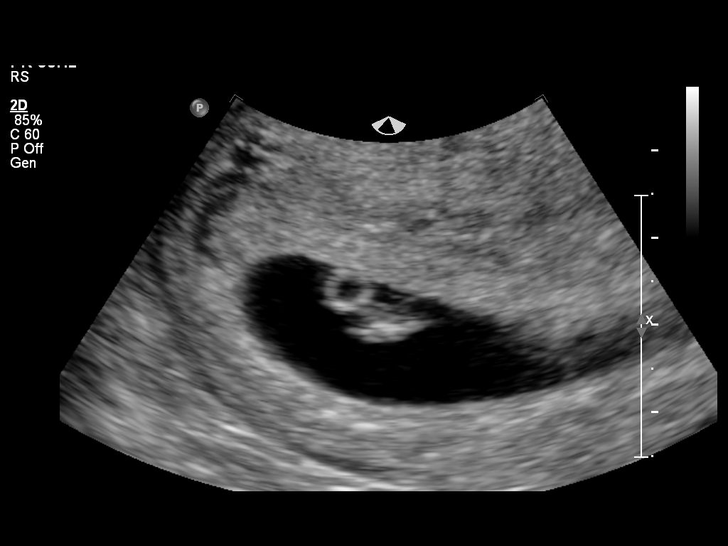
[im 32/42]
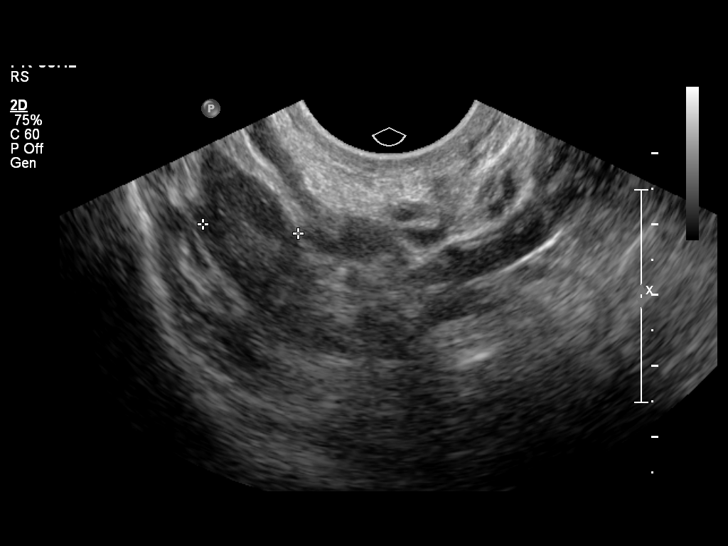
[im 35/42]
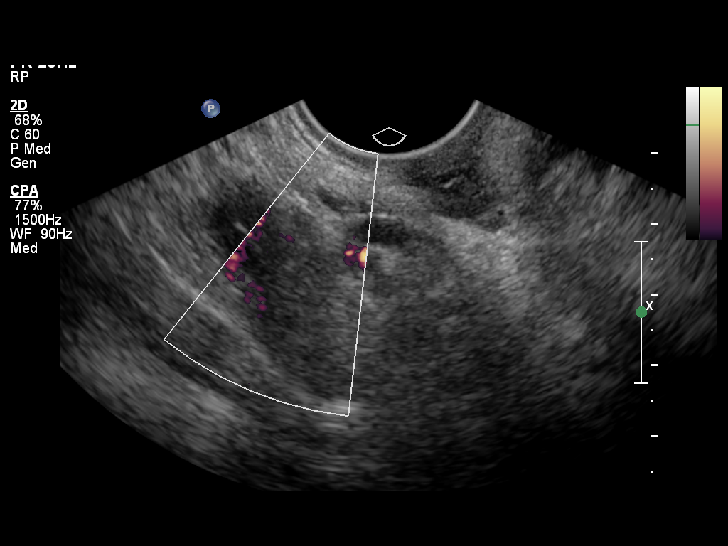
[im 38/42]
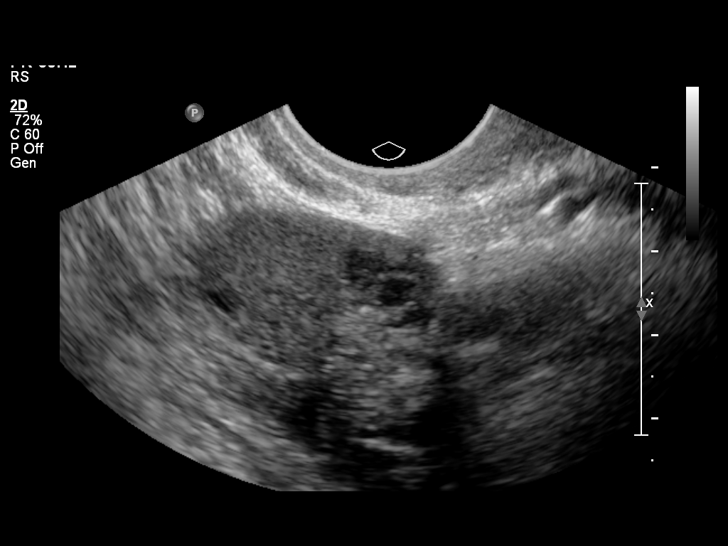
[im 42/42]
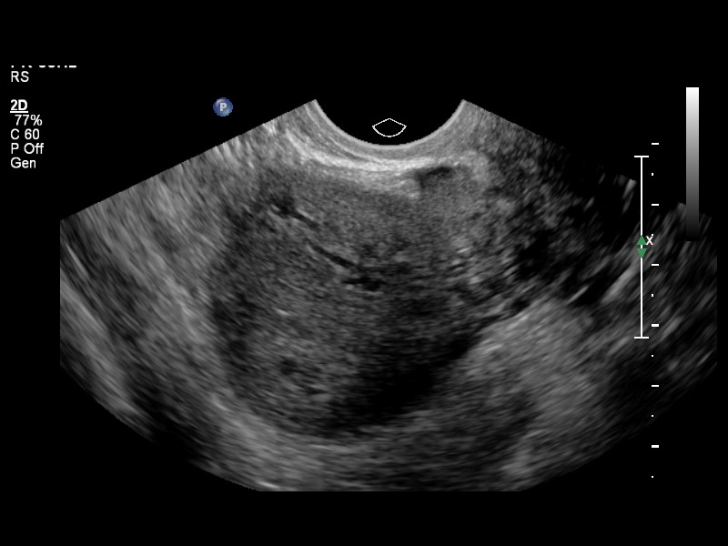

[14 of 28 positions shown; findings below may reference images not displayed]

FINDINGS: Intrauterine gestational sac: Visualized/normal in shape.

Yolk sac:  Visualized

Embryo:  Visualized

Cardiac Activity: Visualized

Heart Rate:  160 bpm

CRL:   16  mm   8 w 0 d                  US EDC: 08/17/2014

Maternal uterus/adnexae: Both ovaries are normal in appearance. No
mass or free fluid visualized.
IMPRESSION: Single living IUP measuring 8 weeks 0 days with US EDC of
08/17/2014.

No significant maternal uterine or adnexal abnormality identified.

## 2016-03-20 IMAGING — US US OB DETAIL+14 WK
1 series · 12 of 28 positions shown · non-contrast
Comparison: none

[Series 1: us ob detail +14 wk · 69 acquisitions, 12 frames shown]
[im 3/69]
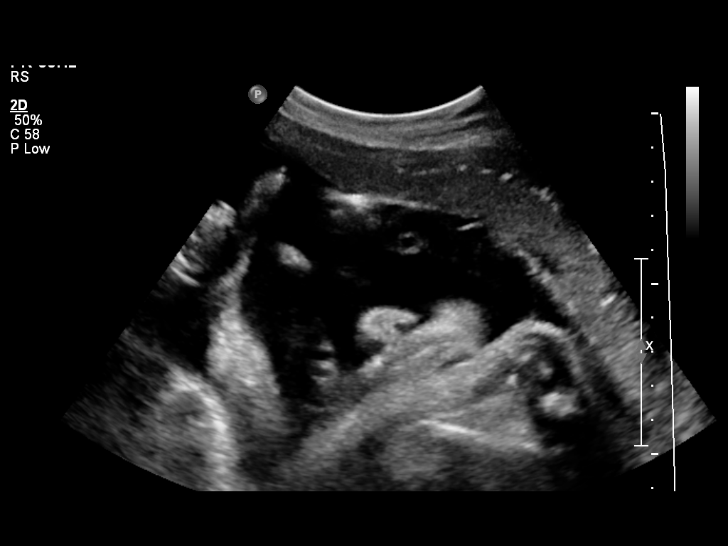
[im 8/69]
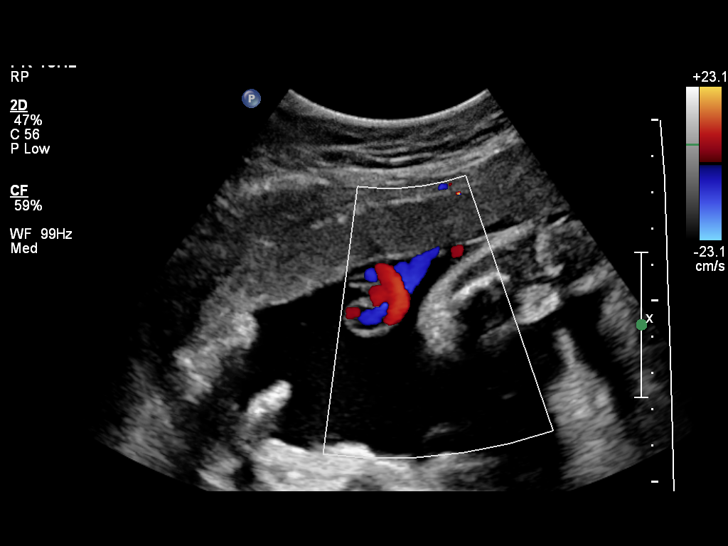
[im 13/69]
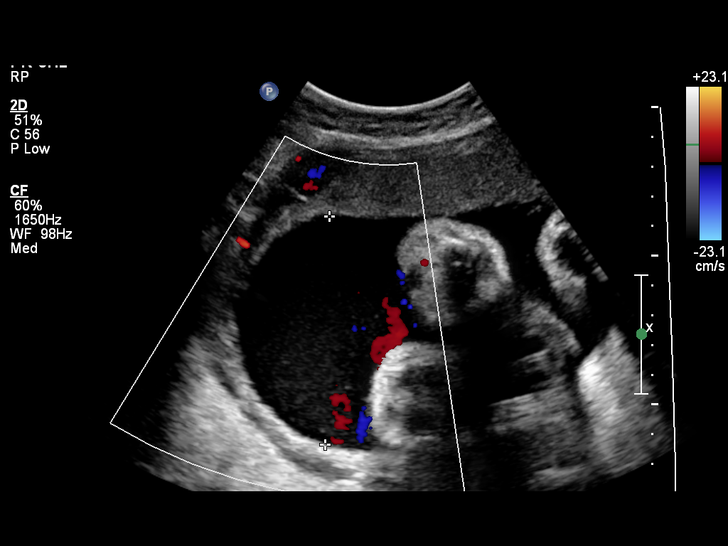
[im 21/69]
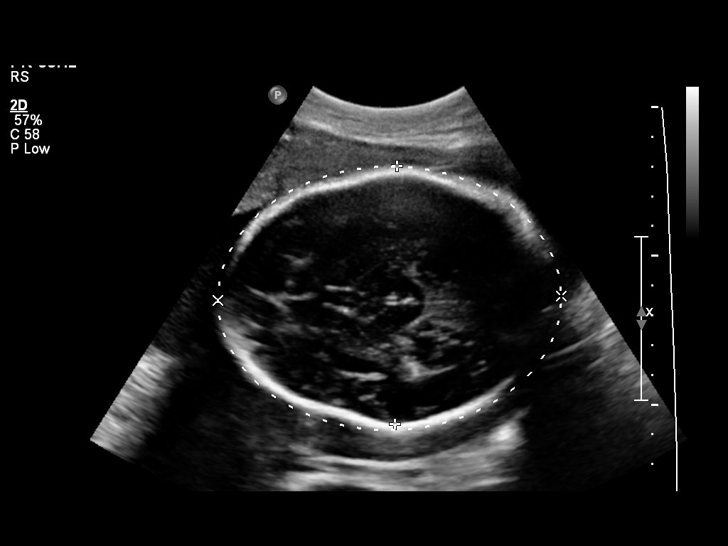
[im 26/69]
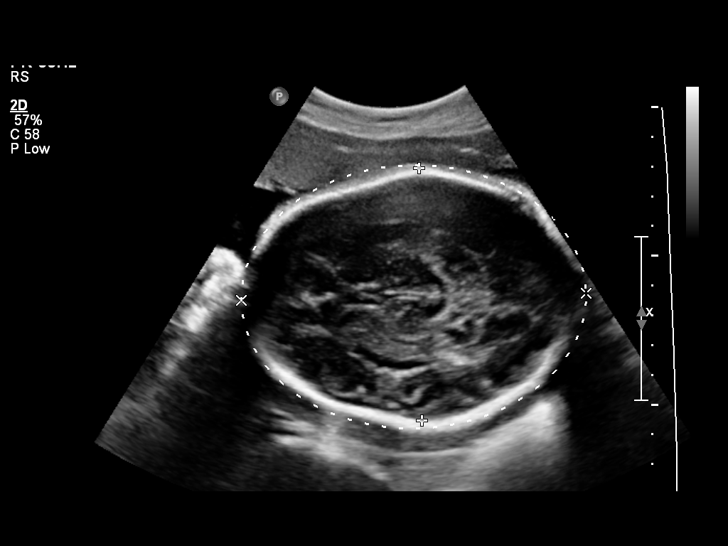
[im 31/69]
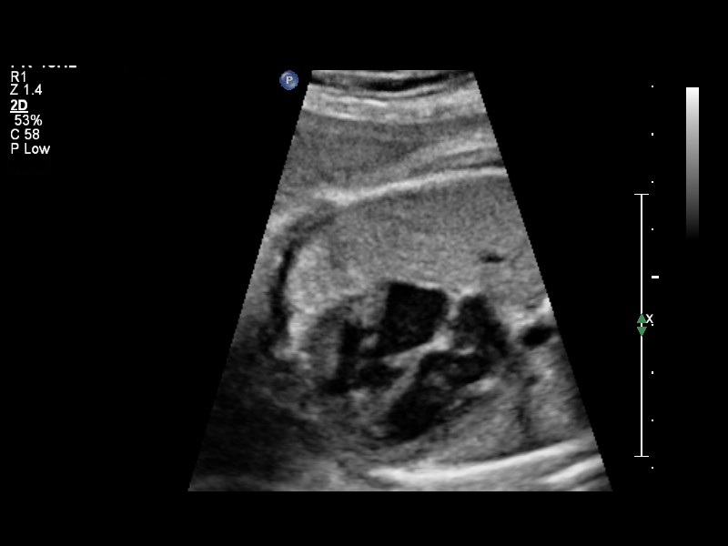
[im 38/69]
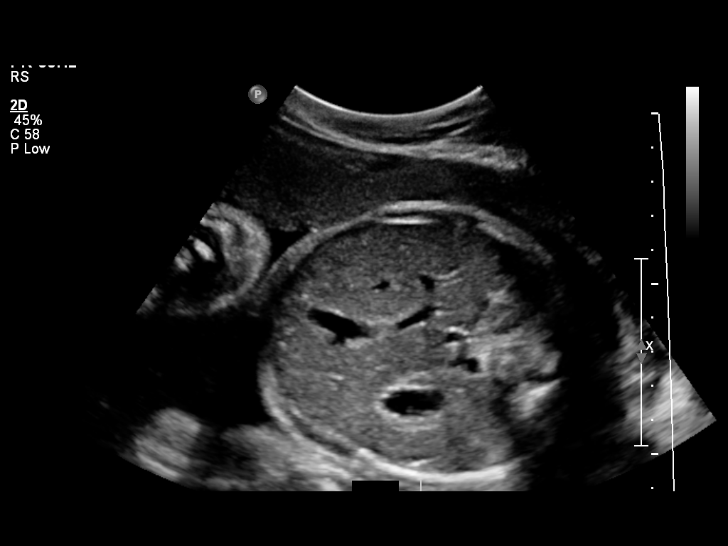
[im 43/69]
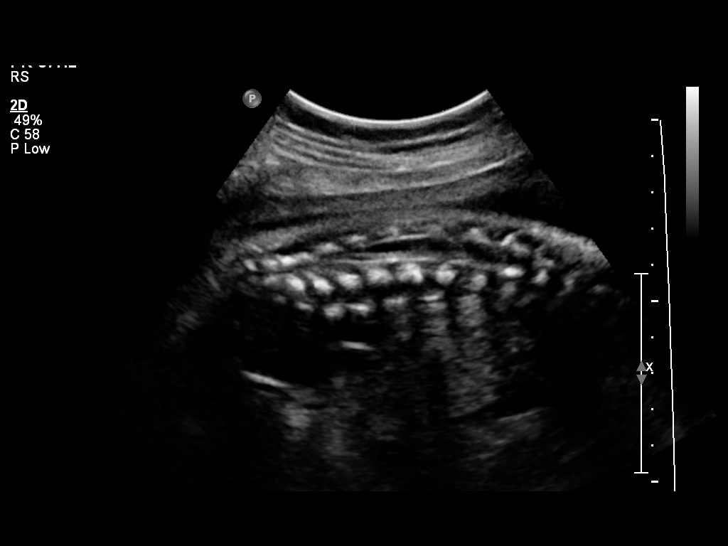
[im 48/69]
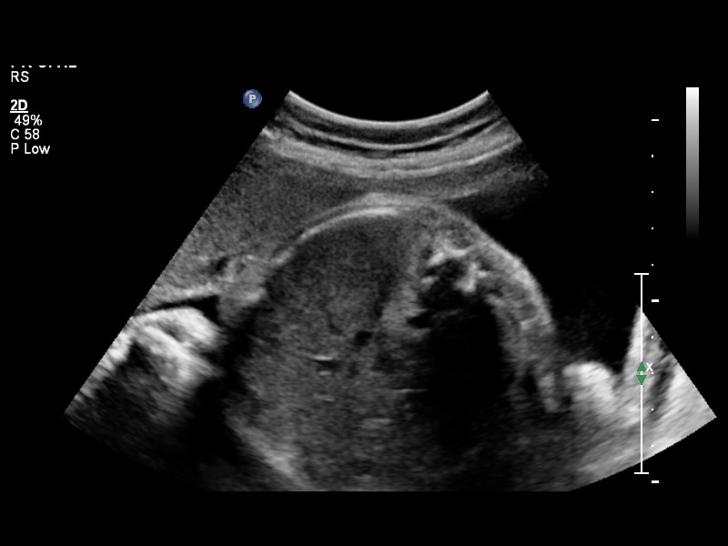
[im 56/69]
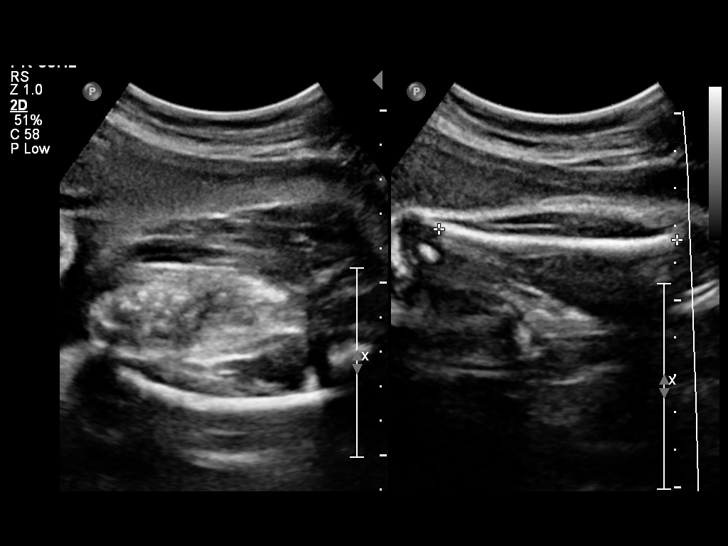
[im 61/69]
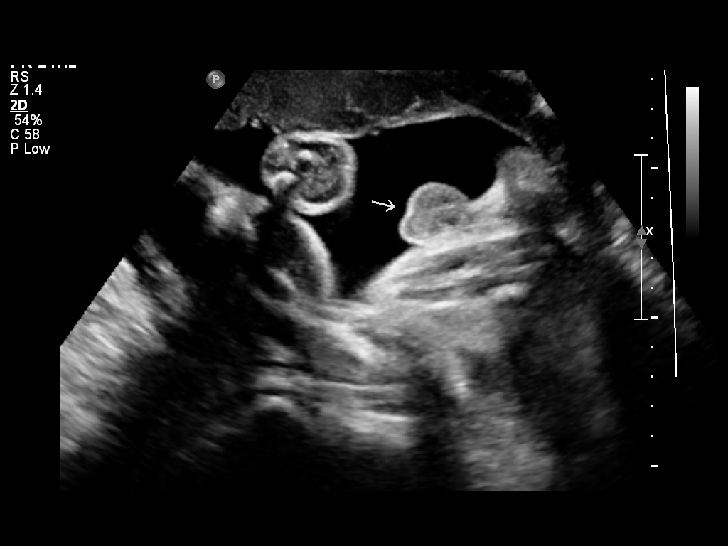
[im 66/69]
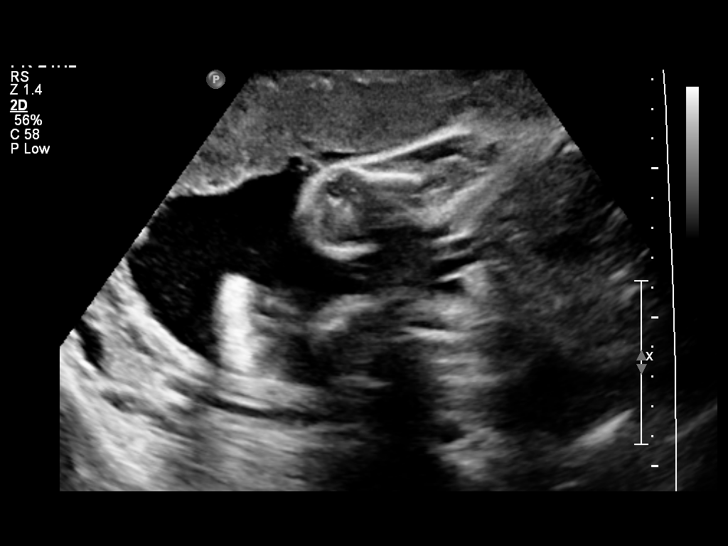

[12 of 28 positions shown; findings below may reference images not displayed]

OBSTETRICS REPORT
                      (Signed Final 06/24/2014 [DATE])

                                                         Faculty Physician
Service(s) Provided

 US OB DETAIL + 14 WK                                  76811.0
Indications

 Advanced maternal age (AMA), Multigravida
 Previous cesarean section
 Poor obstetric history: Previous preterm delivery
 (previa)
Fetal Evaluation

 Num Of Fetuses:    1
 Fetal Heart Rate:  155                          bpm
 Cardiac Activity:  Observed
 Presentation:      Cephalic
 Placenta:          Anterior, above cervical os
 P. Cord            Visualized, central
 Insertion:

 Amniotic Fluid
 AFI FV:      Subjectively within normal limits
 AFI Sum:     18.34   cm       68  %Tile     Larg Pckt:    7.64  cm
 RUQ:   7.64    cm   RLQ:    3.81   cm    LUQ:   2.98    cm   LLQ:    3.91   cm
Biometry

 BPD:     85.5  mm     G. Age:  34w 3d                CI:        71.34   70 - 86
                                                      FL/HC:      19.5   19.1 -

 HC:     322.4  mm     G. Age:  36w 3d     > 97  %    HC/AC:      1.12   0.96 -

 AC:     288.4  mm     G. Age:  32w 6d       82  %    FL/BPD:     73.7   71 - 87
 FL:        63  mm     G. Age:  32w 4d       65  %    FL/AC:      21.8   20 - 24
 HUM:       56  mm     G. Age:  32w 4d       71  %

 Est. FW:    7189  gm    4 lb 12 oz      81  %
Gestational Age

 LMP:           31w 4d        Date:  11/15/13                 EDD:   08/22/14
 U/S Today:     34w 1d                                        EDD:   08/04/14
 Best:          31w 4d     Det. By:  LMP  (11/15/13)          EDD:   08/22/14
Anatomy

 Cranium:          Appears normal         Aortic Arch:      Not well visualized
 Fetal Cavum:      Appears normal         Ductal Arch:      Not well visualized
 Ventricles:       Appears normal         Diaphragm:        Appears normal
 Choroid Plexus:   Appears normal         Stomach:          Appears normal, left
                                                            sided
 Cerebellum:       Appears normal         Abdomen:          Appears normal
 Posterior Fossa:  Appears normal         Abdominal Wall:   Not well visualized
 Nuchal Fold:      Appears normal         Cord Vessels:     Appears normal (3
                                                            vessel cord)
 Face:             Orbits appear          Kidneys:          Appear normal
                   normal
 Lips:             Appears normal         Bladder:          Appears normal
 Heart:            Appears normal         Spine:            Appears normal
                   (4CH, axis, and
                   situs)
 RVOT:             Appears normal         Lower             Appears normal
                                          Extremities:
 LVOT:             Appears normal         Upper             Appears normal
                                          Extremities:

 Other:  Male gender. Nasal bone visualized. Heels visualized. Right 5th
         visualized. Technically difficult due to advanced gestational age.
Cervix Uterus Adnexa

 Cervix:       Not visualized (advanced GA >53wks)
 Uterus:       No abnormality visualized.
 Cul De Sac:   No free fluid seen.
 Left Ovary:    Within normal limits.
 Right Ovary:   Not visualized.

 Adnexa:     No abnormality visualized.
Impression

 Single IUP at 31w 4d
 Normal fetal anatomic survey; however, somewhat limited
 due to late gestational age
 Fetal growth is appropriate (81st %tile)
 Anterior placenta without previa
 Normal amniotic fluid volume
Recommendations

 Recommend follow-up ultrasound examination in 4 weeks for
 growth given late onset of care.

 Thank you for sharing in the care of Ms. CORAIMA WOZNIAK with
 questions or concerns.

## 2017-08-29 ENCOUNTER — Other Ambulatory Visit: Payer: Self-pay | Admitting: Surgery

## 2017-08-31 ENCOUNTER — Other Ambulatory Visit: Payer: Self-pay | Admitting: Surgery
